# Patient Record
Sex: Male | Born: 1970 | Race: White | Hispanic: No | Marital: Married
Health system: Southern US, Community
[De-identification: ages and names within clinical notes are randomized; demographics above are authoritative.]

---

## 2016-08-17 ENCOUNTER — Other Ambulatory Visit: Payer: Self-pay | Admitting: Family Medicine

## 2016-08-17 DIAGNOSIS — R109 Unspecified abdominal pain: Secondary | ICD-10-CM

## 2016-08-23 ENCOUNTER — Ambulatory Visit
Admission: RE | Admit: 2016-08-23 | Discharge: 2016-08-23 | Disposition: A | Discharge status: Home / Self Care | Payer: 59 | Source: Ambulatory Visit | Attending: Family Medicine | Admitting: Family Medicine

## 2016-08-23 DIAGNOSIS — R109 Unspecified abdominal pain: Secondary | ICD-10-CM

## 2019-07-19 ENCOUNTER — Emergency Department (HOSPITAL_COMMUNITY)
Admission: EM | Admit: 2019-07-19 | Discharge: 2019-07-19 | Disposition: A | Discharge status: Home / Self Care | Payer: POS | Attending: Emergency Medicine | Admitting: Emergency Medicine

## 2019-07-19 ENCOUNTER — Other Ambulatory Visit: Payer: Self-pay

## 2019-07-19 ENCOUNTER — Encounter (HOSPITAL_COMMUNITY): Payer: Self-pay

## 2019-07-19 ENCOUNTER — Emergency Department (HOSPITAL_COMMUNITY): Payer: POS

## 2019-07-19 DIAGNOSIS — Y999 Unspecified external cause status: Secondary | ICD-10-CM | POA: Insufficient documentation

## 2019-07-19 DIAGNOSIS — M542 Cervicalgia: Secondary | ICD-10-CM | POA: Insufficient documentation

## 2019-07-19 DIAGNOSIS — S40012A Contusion of left shoulder, initial encounter: Secondary | ICD-10-CM

## 2019-07-19 DIAGNOSIS — Y9389 Activity, other specified: Secondary | ICD-10-CM | POA: Diagnosis not present

## 2019-07-19 DIAGNOSIS — W109XXA Fall (on) (from) unspecified stairs and steps, initial encounter: Secondary | ICD-10-CM | POA: Diagnosis not present

## 2019-07-19 DIAGNOSIS — Y92008 Other place in unspecified non-institutional (private) residence as the place of occurrence of the external cause: Secondary | ICD-10-CM | POA: Diagnosis not present

## 2019-07-19 DIAGNOSIS — R51 Headache: Secondary | ICD-10-CM | POA: Insufficient documentation

## 2019-07-19 DIAGNOSIS — R0789 Other chest pain: Secondary | ICD-10-CM | POA: Diagnosis not present

## 2019-07-19 DIAGNOSIS — S4992XA Unspecified injury of left shoulder and upper arm, initial encounter: Secondary | ICD-10-CM | POA: Diagnosis present

## 2019-07-19 DIAGNOSIS — M7918 Myalgia, other site: Secondary | ICD-10-CM

## 2019-07-19 DIAGNOSIS — M25512 Pain in left shoulder: Secondary | ICD-10-CM

## 2019-07-19 NOTE — ED Triage Notes (Signed)
Patient arrived via [facility]    Patient was standing at the top of the stairs and reports "I don't know how it happened, but I was just falling down boom, boom, boom on my back. I mean maybe I tripped over my socks".   Patient received 182mcg of Fentanyl with EMS for 10/10 pain. Pain decreased to a 6/10 shortly after.   Patient reports 8/10 left upper back pain and left neck pain, unable to move arm because of pain. Pain increases upon inhalation, no SOB, no alcohol consumption, no drug use.   Patient has chronic lower back pain since '96.   Per EMS patient has no evidence of neck or spinal injury, but put on the c-collar "just in case".   Vitals were stable with [facility], A&Ox4

## 2019-07-19 NOTE — Discharge Instructions (Signed)
Please read and follow all provided instructions.  Your diagnoses today include:  1. Acute pain of left shoulder   2. Contusion of left shoulder, initial encounter   3. Musculoskeletal pain     Tests performed today include:  Vital signs. See below for your results today.   Medications prescribed:   Please use over-the-counter NSAID medications (ibuprofen, naproxen) as directed on the packaging for pain.   Take any prescribed medications only as directed.  Home care instructions:  Follow any educational materials contained in this packet. The worst pain and soreness will be 24-48 hours after the fall. Your symptoms should resolve steadily over several days at this time. Use warmth on affected areas as needed.   Follow-up instructions: Please follow-up with your primary care provider in 1 week for further evaluation of your symptoms if they are not completely improved.   Return instructions:   Please return to the Emergency Department if you experience worsening symptoms.   Please return if you experience increasing pain, vomiting, vision or hearing changes, confusion, numbness or tingling in your arms or legs, or if you feel it is necessary for any reason.   Please return if you have any other emergent concerns.  Additional Information:  Your vital signs today were: BP (!) 157/95 (BP Location: Right Arm)    Pulse 60    Temp 98.4 F (36.9 C) (Oral)    Resp 16    SpO2 100%  If your blood pressure (BP) was elevated above 135/85 this visit, please have this repeated by your doctor within one month. --------------

## 2019-07-19 NOTE — ED Notes (Signed)
Patient verbalizes understanding of discharge instructions. Opportunity for questioning and answers were provided. Armband removed by staff, pt discharged from ED.  

## 2019-07-19 NOTE — ED Provider Notes (Signed)
[facility] Provider [facility]   CSN: [id number] Arrival date & time: 07/19/19  1415     History   Chief Complaint Chief Complaint  Patient presents with  . Fall    HPI Christopher Freeman is a 48 y.o. male.     Patient presents to the emergency department today with complaint of a fall occurring just prior to arrival.  Patient was transported to the emergency department by EMS.  Patient was feeling in a completely normal state of health when he went to go down several stairs.  He lost his footing and slipped rolling down about 12 stairs on his left side.  His wife heard him fall and went to see him.  Patient was slightly dazed but not confused.  He was awake and talkative.  Patient complained of neck pain (improved), left shoulder pain, numbness in his left arm (new improving), left axillary rib cage pain.  Patient was given 100 mcg of fentanyl prior by EMS.  Pain is currently controlled.  Pain is worse with movement.  Patient denies any difficulty breathing, other chest pain, abdominal pain.  No pain in hips or legs.  He has not had any vomiting, vision changes.  He denies drugs or alcohol.  No anticoagulant use.       History reviewed. No pertinent past medical history.  There are no active problems to display for this patient.   History reviewed. No pertinent surgical history.      Home Medications    Prior to Admission medications   Not on File    Family History History reviewed. No pertinent family history.  Social History Social History   Tobacco Use  . Smoking status: Not on file  Substance Use Topics  . Alcohol use: Not on file  . Drug use: Not on file     Allergies   Patient has no allergy information on record.   Review of Systems Review of Systems  Constitutional: Negative for fatigue.  HENT: Negative for tinnitus.   Eyes: Negative for photophobia, pain and visual disturbance.  Respiratory: Negative for  shortness of breath.   Cardiovascular: Negative for chest pain.  Gastrointestinal: Negative for nausea and vomiting.  Musculoskeletal: Positive for arthralgias, back pain and neck pain. Negative for gait problem.  Skin: Negative for wound.  Neurological: Positive for numbness (resolved). Negative for dizziness, weakness, light-headedness and headaches.  Psychiatric/Behavioral: Negative for confusion and decreased concentration.     Physical Exam Updated Vital Signs BP (!) 157/95 (BP Location: Right Arm)   Pulse 60   Temp 98.4 F (36.9 C) (Oral)   Resp 16   SpO2 100%   Physical Exam Vitals signs and nursing note reviewed.  Constitutional:      Appearance: He is well-developed.  HENT:     Head: Normocephalic and atraumatic. No raccoon eyes or Battle's sign.     Right Ear: Tympanic membrane, ear canal and external ear normal. No hemotympanum.     Left Ear: Tympanic membrane, ear canal and external ear normal. No hemotympanum.     Nose: Nose normal.  Eyes:     General: Lids are normal.     Conjunctiva/sclera: Conjunctivae normal.     Pupils: Pupils are equal, round, and reactive to light.     Comments: No visible hyphema  Neck:     Musculoskeletal: Neck supple.     Comments: Immobilized in c-collar. Cardiovascular:     Rate and Rhythm: Normal rate and regular  rhythm.     Comments: Chest wall pain with palpation just below the left axilla. Pulmonary:     Effort: Pulmonary effort is normal.     Breath sounds: Normal breath sounds.     Comments: Good air movement bilaterally. Abdominal:     Palpations: Abdomen is soft.     Tenderness: There is no abdominal tenderness.  Musculoskeletal:        General: Tenderness present. No swelling.     Left shoulder: He exhibits decreased range of motion (Able to lift arm but with significant pain), tenderness and bony tenderness. He exhibits no swelling.     Left elbow: Normal.     Left wrist: Normal.     Left hip: Normal.     Cervical  back: He exhibits normal range of motion, no tenderness and no bony tenderness.     Thoracic back: He exhibits no tenderness and no bony tenderness.     Lumbar back: He exhibits no tenderness and no bony tenderness.     Left upper arm: Normal.     Left forearm: Normal.     Left hand: Normal.     Left upper leg: Normal.  Skin:    General: Skin is warm and dry.  Neurological:     Mental Status: He is alert and oriented to person, place, and time.     GCS: GCS eye subscore is 4. GCS verbal subscore is 5. GCS motor subscore is 6.     Cranial Nerves: No cranial nerve deficit.     Sensory: No sensory deficit.     Coordination: Coordination normal.     Deep Tendon Reflexes: Reflexes are normal and symmetric.      ED Treatments / Results  Labs (all labs ordered are listed, but only abnormal results are displayed) Labs Reviewed - No data to display  EKG None  Radiology Dg Chest 2 View  Result Date: 07/19/2019 CLINICAL DATA:  Pain after fall EXAM: CHEST - 2 VIEW COMPARISON:  None. FINDINGS: The heart size and mediastinal contours are within normal limits. Both lungs are clear. The visualized skeletal structures are unremarkable. IMPRESSION: No active cardiopulmonary disease. Electronically Signed   By: Dorise Bullion Freeman M.D   On: 07/19/2019 16:58   Dg Cervical Spine 2-3 View Clearing  Result Date: 07/19/2019 CLINICAL DATA:  Pain after fall EXAM: LIMITED CERVICAL SPINE FOR TRAUMA CLEARING - 2-3 VIEW COMPARISON:  None. FINDINGS: Straightening of normal lordosis. No traumatic malalignment. No fracture. Multilevel degenerative changes with anterior osteophytes. No other abnormalities. IMPRESSION: No fracture or traumatic malalignment.  Degenerative changes noted. Electronically Signed   By: Dorise Bullion Freeman M.D   On: 07/19/2019 16:59   Dg Shoulder Left  Result Date: 07/19/2019 CLINICAL DATA:  Pain after fall EXAM: LEFT SHOULDER - 2+ VIEW COMPARISON:  None. FINDINGS: There is no evidence of  fracture or dislocation. There is no evidence of arthropathy or other focal bone abnormality. Soft tissues are unremarkable. IMPRESSION: Negative. Electronically Signed   By: Dorise Bullion Freeman M.D   On: 07/19/2019 16:59    Procedures Procedures (including critical care time)  Medications Ordered in ED Medications - No data to display   Initial Impression / Assessment and Plan / ED Course  I have reviewed the triage vital signs and the nursing notes.  Pertinent labs & imaging results that were available during my care of the patient were reviewed by me and considered in my medical decision making (see chart for details).  Patient seen and examined. Work-up initiated.  Patient declines pain medications at this time.  Will obtain plain film imaging.  No preceding symptoms and this seems consistent with a non-syncopal fall.  Vital signs reviewed and are as follows: BP (!) 157/95 (BP Location: Right Arm)   Pulse 60   Temp 98.4 F (36.9 C) (Oral)   Resp 16   SpO2 100%   5:09 PM x-rays reviewed and patient reassessed.  I removed the cervical collar.  Patient has not developed any pain in his neck or difficulty with movement.  He has had no progression of symptoms.  No confusion, vomiting.  He has a mild headache.  At this point no indication for further imaging.  Patient is comfortable with going home.  He states that he will use sling for comfort, Tylenol and ibuprofen at home.  Offered muscle relaxer, he declines.  Discussed follow-up with his PCP in 1 week if his pain is not improving as expected.  Final Clinical Impressions(s) / ED Diagnoses   Final diagnoses:  Acute pain of left shoulder  Contusion of left shoulder, initial encounter  Musculoskeletal pain   Patient with left upper body pain after a fall down several stairs today.  No significant head injury suspected and patient with negative head CT rules.  He has not decompensated while in the emergency department.  He  does not show any symptoms suggestive of concussion.  No peripheral neurological deficits.  Pain seems to be worse in his left shoulder region and is worse with movement.  Left upper extremity is neurovascularly intact.  No lower extremity injuries or neurologic deficits.  Chest x-ray is clear.  Patient without any chest pain, abdominal pain, shortness of breath.  Do not feel that advanced imaging of the chest, abdomen, pelvis indicated at this time.  Will treat symptomatically as above.  ED Discharge Orders    None       Renne CriglerGeiple, Alliene Klugh, Cordelia Poche-C 07/19/19 1711    Tilden Fossaees, Elizabeth, MD 07/19/19 [phone]

## 2020-03-03 ENCOUNTER — Ambulatory Visit: Payer: POS | Attending: Internal Medicine

## 2020-03-03 DIAGNOSIS — Z23 Encounter for immunization: Secondary | ICD-10-CM

## 2020-03-03 NOTE — Progress Notes (Signed)
   Covid-19 Vaccination Clinic  Name:  Christopher Freeman    MRN: [id number] DOB: 10-10-1971  03/03/2020  Mr. Ortwein was observed post Covid-19 immunization for 15 minutes without incident. He was provided with Vaccine Information Sheet and instruction to access the V-Safe system.   Mr. Shimel was instructed to call 911 with any severe reactions post vaccine: Marland Kitchen Difficulty breathing  . Swelling of face and throat  . A fast heartbeat  . A bad rash all over body  . Dizziness and weakness   Immunizations Administered    Name Date Dose VIS Date Route   Pfizer COVID-19 Vaccine 03/03/2020  8:26 AM 0.3 mL 11/20/2019 Intramuscular   Manufacturer: [employer], [employer]   Lot: [id number]   NDC: [address]-0

## 2020-03-28 ENCOUNTER — Ambulatory Visit: Payer: POS | Attending: Internal Medicine

## 2020-03-28 DIAGNOSIS — Z23 Encounter for immunization: Secondary | ICD-10-CM

## 2020-03-28 NOTE — Progress Notes (Signed)
   Covid-19 Vaccination Clinic  Name:  Christopher Freeman    MRN: [id number] DOB: October 14, 1971  03/28/2020  [name] was observed post Covid-19 immunization for 15 minutes without incident. He was provided with Vaccine Information Sheet and instruction to access the V-Safe system.   [name] was instructed to call 911 with any severe reactions post vaccine: Marland Kitchen Difficulty breathing  . Swelling of face and throat  . A fast heartbeat  . A bad rash all over body  . Dizziness and weakness   Immunizations Administered    Name Date Dose VIS Date Route   Pfizer COVID-19 Vaccine 03/28/2020  8:28 AM 0.3 mL 02/03/2019 Intramuscular   Manufacturer: [employer], [employer]   Lot: [id number]   NDC: [address]-4

## 2020-04-05 ENCOUNTER — Other Ambulatory Visit (HOSPITAL_BASED_OUTPATIENT_CLINIC_OR_DEPARTMENT_OTHER): Payer: Self-pay | Admitting: Family Medicine

## 2020-04-05 DIAGNOSIS — N50811 Right testicular pain: Secondary | ICD-10-CM

## 2020-04-06 ENCOUNTER — Ambulatory Visit (HOSPITAL_BASED_OUTPATIENT_CLINIC_OR_DEPARTMENT_OTHER)
Admission: RE | Admit: 2020-04-06 | Discharge: 2020-04-06 | Disposition: A | Discharge status: Home / Self Care | Payer: POS | Source: Ambulatory Visit | Attending: Family Medicine | Admitting: Family Medicine

## 2020-04-06 ENCOUNTER — Other Ambulatory Visit: Payer: Self-pay

## 2020-04-06 DIAGNOSIS — N50811 Right testicular pain: Secondary | ICD-10-CM | POA: Diagnosis present

## 2020-12-29 ENCOUNTER — Other Ambulatory Visit: Payer: Self-pay | Admitting: Family Medicine

## 2020-12-29 DIAGNOSIS — M542 Cervicalgia: Secondary | ICD-10-CM

## 2021-01-11 ENCOUNTER — Ambulatory Visit
Admission: RE | Admit: 2021-01-11 | Discharge: 2021-01-11 | Disposition: A | Discharge status: Home / Self Care | Payer: POS | Source: Ambulatory Visit | Attending: Family Medicine | Admitting: Family Medicine

## 2021-01-11 DIAGNOSIS — M542 Cervicalgia: Secondary | ICD-10-CM

## 2021-11-16 IMAGING — US US SCROTUM W/ DOPPLER COMPLETE
1 series · 13 of 25 positions shown · non-contrast
Comparison: 05/09/2011

CLINICAL DATA: Right testicular pain.  Vasectomy 1211.

EXAM:
SCROTAL ULTRASOUND
DOPPLER ULTRASOUND OF THE TESTICLES
TECHNIQUE: Complete ultrasound examination of the testicles, epididymis, and
other scrotal structures was performed. Color and spectral Doppler
ultrasound were also utilized to evaluate blood flow to the
testicles.

[Series 1: us scrotum w/ doppler complete · 13 of 45 slices shown]
[im 1/45]
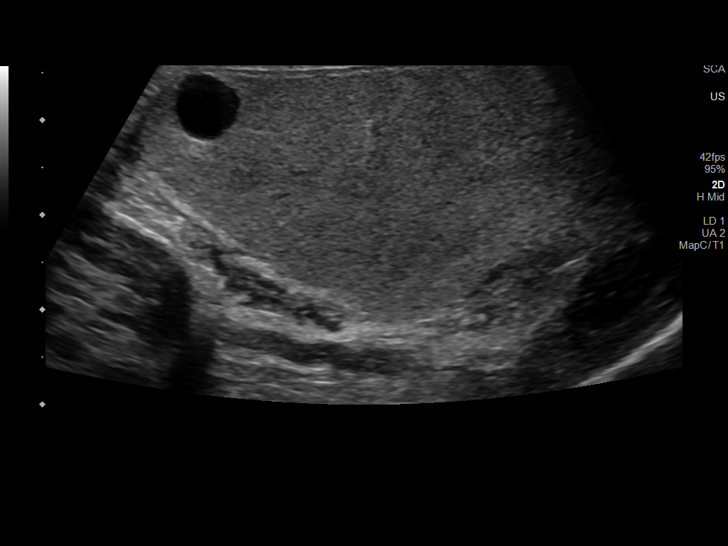
[im 4/45]
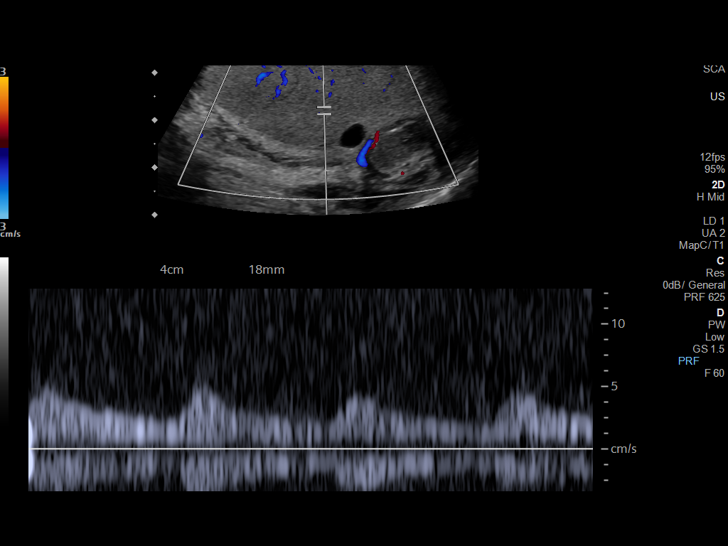
[im 8/45]
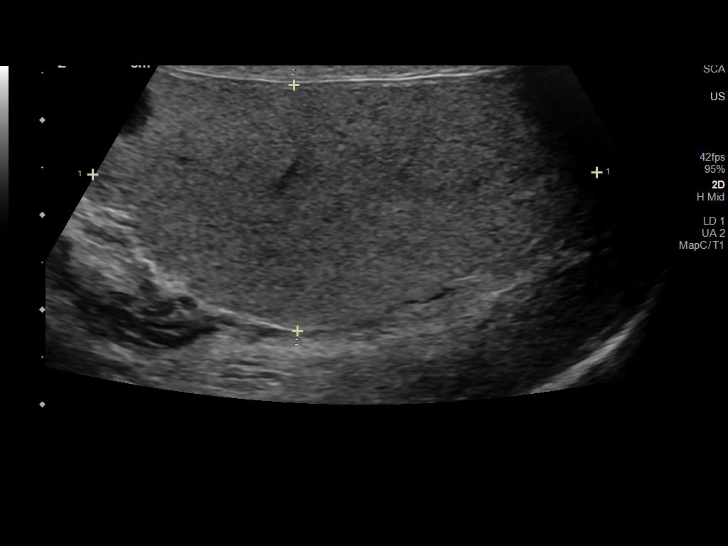
[im 12/45]
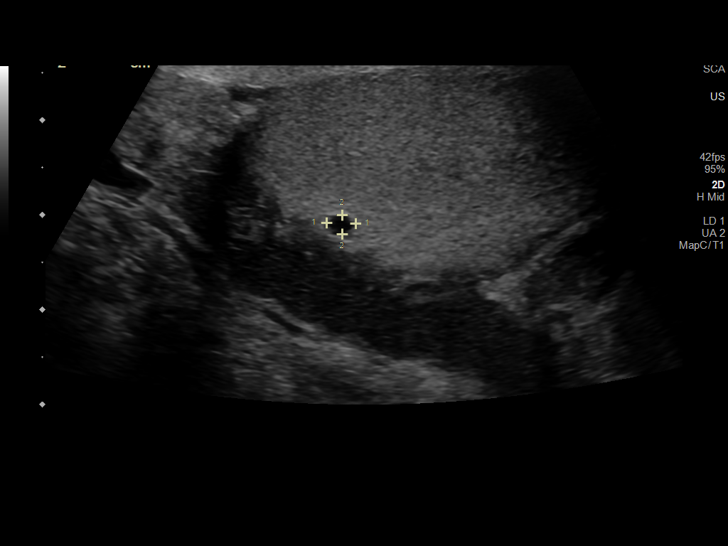
[im 15/45]
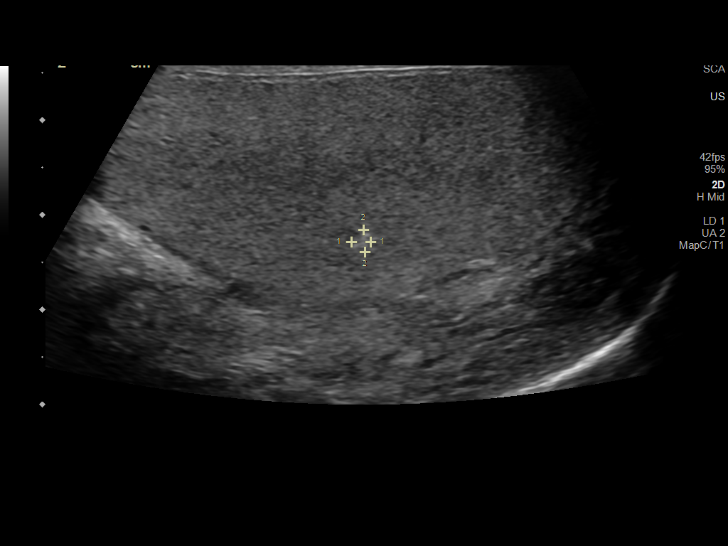
[im 19/45]
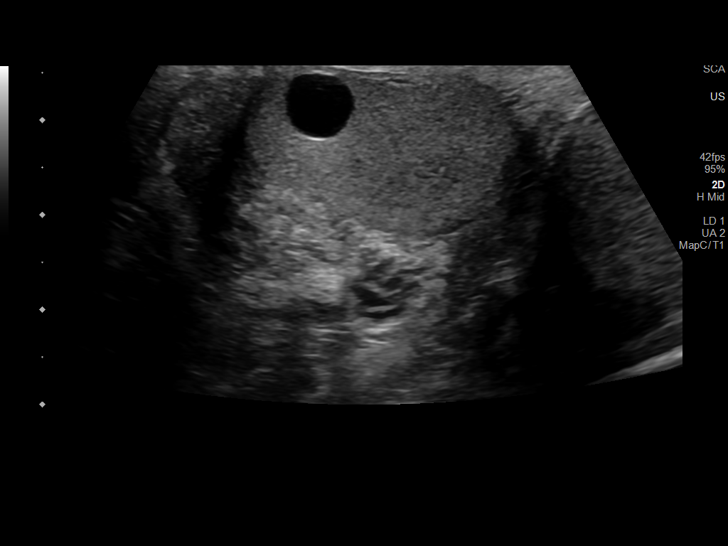
[im 23/45]
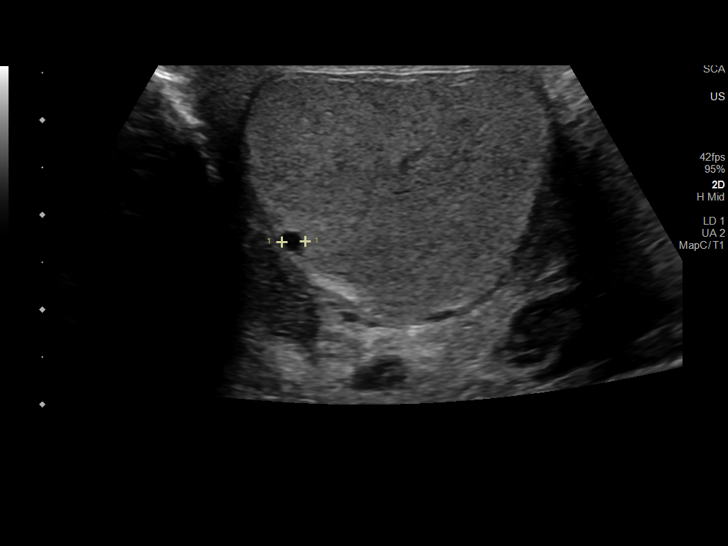
[im 26/45]
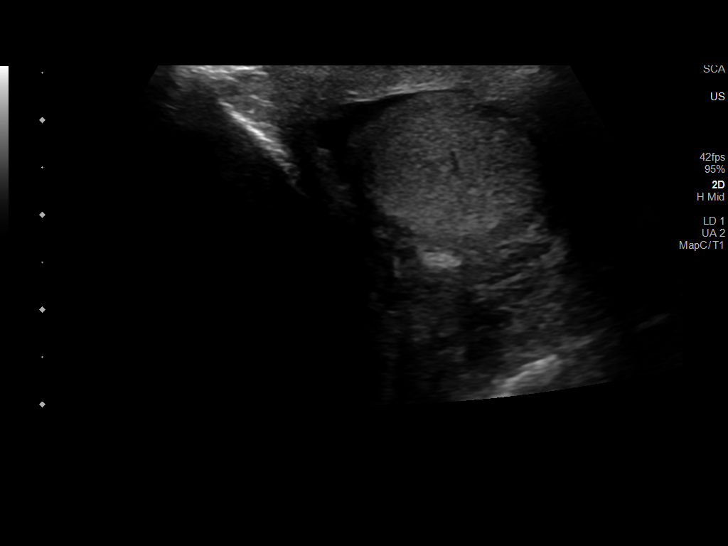
[im 30/45]
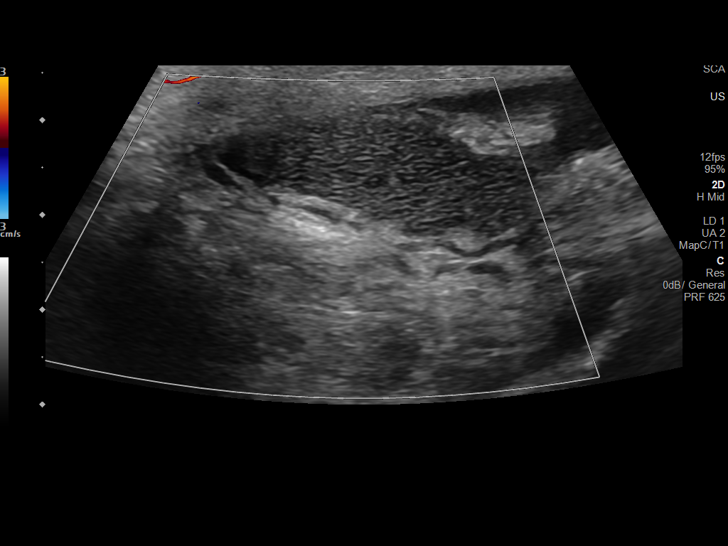
[im 34/45]
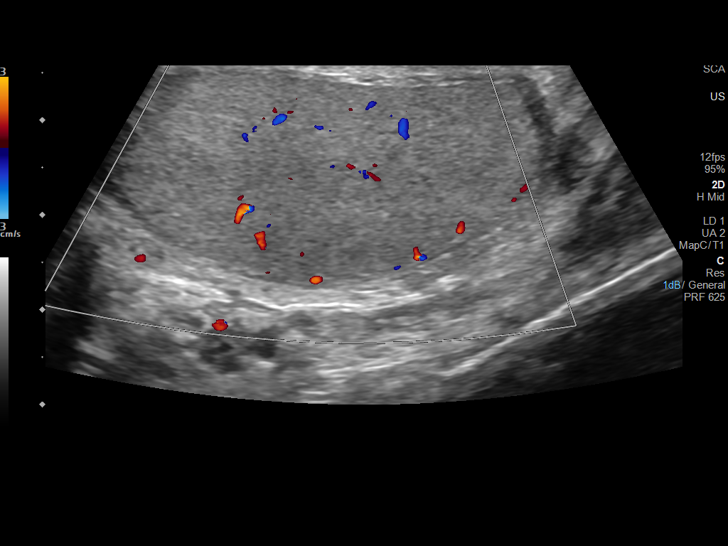
[im 37/45]
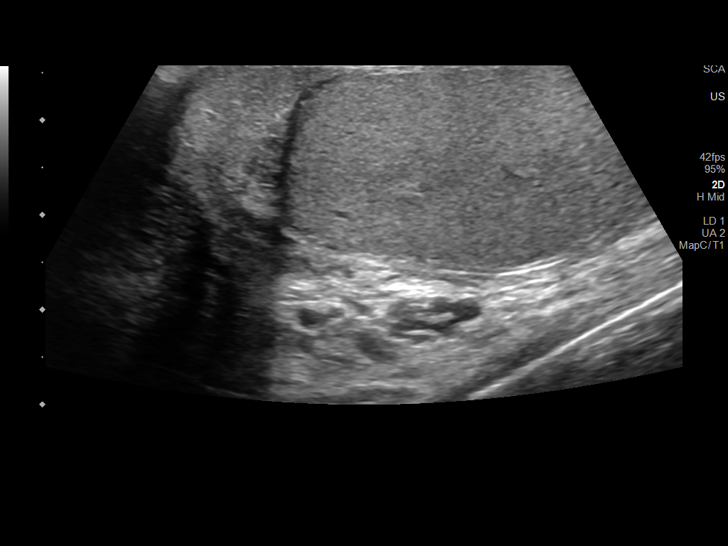
[im 41/45]
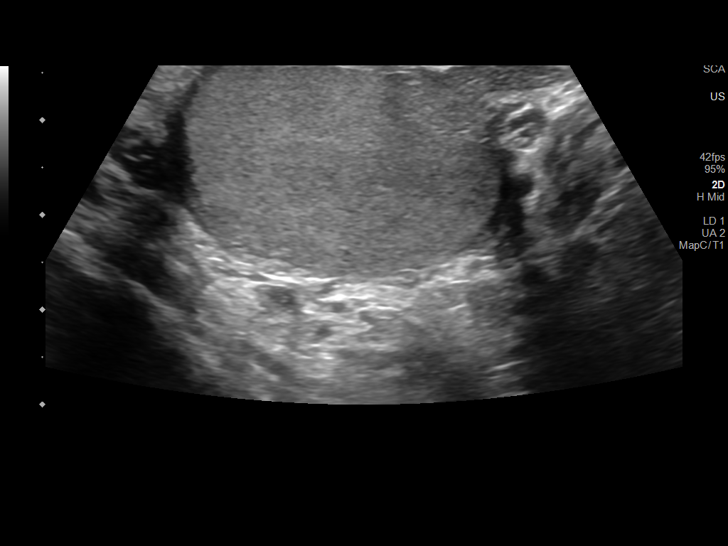
[im 45/45]
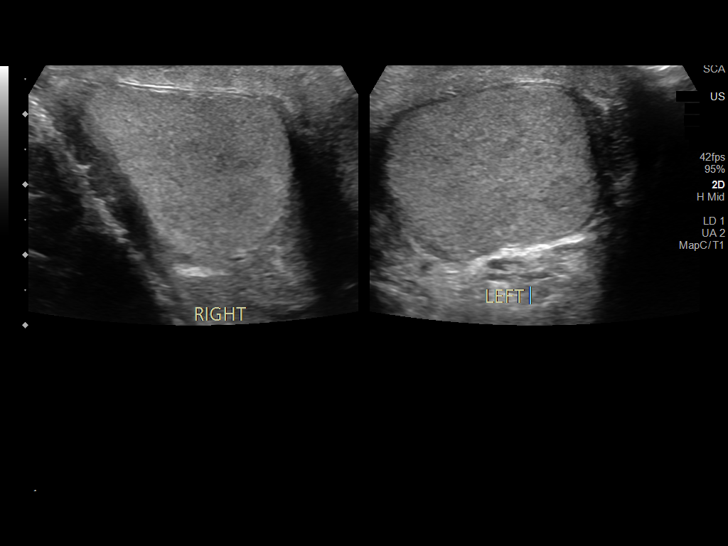

[13 of 25 positions shown; findings below may reference images not displayed]

FINDINGS: Right testicle

Measurements: 5.3 x 2.6 x 3.2 cm. Three intratesticular cysts are
present with the largest measuring 9 mm over the upper pole. At
least 2 of these were seen previously with slight increase in size
in the upper pole cyst which previously measured 4 mm. No focal
solid mass. Single focal 2 mm calcification over the mid pole.

Left testicle

Measurements: 5.2 x 2.3 x 3.1 cm. No mass or microlithiasis
visualized.

Right epididymis:  Normal in size and appearance.

Left epididymis:  Normal in size and appearance.

Hydrocele:  Small bilateral hydroceles right worse than left.

Varicocele:  None visualized.

Pulsed Doppler interrogation of both testes demonstrates normal low
resistance arterial and venous waveforms bilaterally.
IMPRESSION: 1.  Normal size testicles with normal symmetric vascular flow.

2. Three right-sided intratesticular cysts with the largest
measuring 9 mm over the upper pole. Single 2 mm right testicular
calcification.

3.  Tiny bilateral hydroceles right worse than left.

## 2023-01-13 NOTE — Therapy (Signed)
OUTPATIENT PHYSICAL THERAPY THORACOLUMBAR EVALUATION   Patient Name: Christopher Freeman MRN: [id number] DOB:Mar 04, 1971, 52 y.o., male Today's Date: 01/15/2023  END OF SESSION:  PT End of Session - 01/14/23 1717     Visit Number 1    Number of Visits 12    Date for PT Re-Evaluation 02/25/23    Authorization Type [employer]    PT Start Time 1622   Pt was 19 mins late due to going to the wrong entrance   PT Stop Time 1707    PT Time Calculation (min) 45 min    Activity Tolerance Patient tolerated treatment well    Behavior During Therapy [facility] for tasks assessed/performed             History reviewed. No pertinent past medical history. History reviewed. No pertinent surgical history. There are no problems to display for this patient.   PCP: Charlsie Merles, MD  REFERRING PROVIDER: Charlsie Merles, MD  REFERRING DIAG: M54.50 (ICD-10-CM) - Low back pain, unspecified  Rationale for Evaluation and Treatment: Rehabilitation  THERAPY DIAG:  Other low back pain  Muscle weakness (generalized)  Difficulty in walking, not elsewhere classified  ONSET DATE: PT orders in August and December 2023  SUBJECTIVE:                                                                                                                                                                                           SUBJECTIVE STATEMENT: Pt reports his back pain began in [employer] om 1995.  He had significant pain when his back popped during sit ups.  He states L4-5 were damaged.  Pt states the next year his back went out on him again when he was bending to lift an objects, so much that he lost his breath.  He states his back gives out on him every year and he has to be very careful with his movements.  He reports no specific time of recent exacerbation.  He reports having sciatica in bilat LE's. Pt's original PT order in August indicated aquatic therapy.  Pt went to a facility for PT though they didn't have  aquatic therapy.  Pt only had an assessment performed, and was trying to find a PT clinic that had a pool for aquatic therapy.   Pt denies any lumbar surgery.  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  Pt has tried acupuncture which only helped for 20 mins.   Pt has received some PT in the past, though has been awhile.     He is very limited with lifting  objects and has to be careful with lifting.  Pt is limited with bending over.  Pt has difficulty sleeping every night.  Pt has excruciating pain with kneeling.  Pt is limited with walks with his wife.  His pain will freeze him with ambulation.  Pt has sciatica daily.  It can cause excruciating pain in bilat ankles which limits him ambulation.  He also has pain with standing and sitting.  He gets H/A's from the pain.  Easing factors:   "not a whole lot that makes it feel better".  TENS units.  medication helps some.  Theracane provides temporary relief.   PERTINENT HISTORY:  Chronic low back pain Hx of cervical pain, depression, HTN, and tinnitus  PAIN:  Are you having pain? Yes NPRS:  6-6.5/10 Current, 10/10 Worst, 5/10 Best Location:  bilat sides of lumbar.  Pain travels down bilat LE's from buttocks to ankles/feet.  He has tingling in feet.  Pt has thoracic pain also  PRECAUTIONS: None  WEIGHT BEARING RESTRICTIONS: No  FALLS:  Has patient fallen in last 6 months? 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   PLOF: Independent.  Pt has a hx of chronic pain which affect his daily activities and functional mobility.   PATIENT GOALS: reduce pain.  To be able to function to have a decent QOL   OBJECTIVE:   DIAGNOSTIC FINDINGS:  Pt has has diagnostic testing at other facilities though PT unable to view any reports.  PATIENT SURVEYS:  Give FOTO next visit.   COGNITION: Overall cognitive status: Within functional limits for  tasks assessed      LUMBAR ROM:   AROM eval  Flexion 40%  Extension 25%  Right lateral flexion 60%  Left lateral flexion 60%  Right rotation 30%  Left rotation 25%    (Pt had pain with all AROM testing)  LOWER EXTREMITY MMT:      MMT  Right eval Left eval  Hip flexion 4-/5 4/5  Hip extension    Hip abduction    Hip adduction    Hip internal rotation    Hip external rotation 3+/5 4/5  Knee flexion 4-/5 seated 4/5 seated   Knee extension 4-/5 4-/5  Ankle dorsiflexion    Ankle plantarflexion Weak tested in sitting.  Weaker on R Weak tested in sitting  Ankle inversion    Ankle eversion     (Blank rows = not tested)    GAIT: Assistive device utilized: None Level of assistance: Complete Independence Comments: Slow, antalgic gait.  Pt c/o's of back pain which worsens down LE's with ambulation.   TODAY'S TREATMENT:                                                                                                                               See below for pt education  PATIENT EDUCATION:  Education details:  Educated pt with aquatic therapy process and aquatic properties, benefits, and purpose.  Educated pt concerning POC, relevant anatomy, objective findings, and dx.  PT answered Pt's questions.  Person educated: Patient Education method: Explanation Education comprehension: verbalized understanding  HOME EXERCISE PROGRAM: None given today  ASSESSMENT:  CLINICAL IMPRESSION: Patient is a 52 y.o. male with a dx of LBP presenting to the clinic with chronic LBP, limited ROM in lumbar, and bilat LE muscle weakness.  Pt c/o's of constant pain and reports his back gives out on him yearly.  He reports having sciatic pain down entire LE's daily.  He has to be careful with his movements and lifting.  Pt is limited with ambulation distance and states he freezes with ambulation occasionally.  Pt is limited with bending and has increased pain with standing and sitting.  He has  disturbed sleep every night.  Pt has orders for aquatic therapy.  Pt may benefit from skilled PT services to address impairments and to improve overall function.    OBJECTIVE IMPAIRMENTS: Abnormal gait, decreased activity tolerance, decreased endurance, decreased mobility, difficulty walking, decreased ROM, decreased strength, hypomobility, increased fascial restrictions, impaired flexibility, and pain.   ACTIVITY LIMITATIONS: carrying, lifting, bending, sitting, standing, squatting, sleeping, stairs, and locomotion level  PARTICIPATION LIMITATIONS: meal prep, cleaning, laundry, shopping, community activity, and occupation  PERSONAL FACTORS: Time since onset of injury/illness/exacerbation and 1-2 comorbidities: cervical/thoracic pain and depression  are also affecting patient's functional outcome.   REHAB POTENTIAL: Good  CLINICAL DECISION MAKING: Evolving/moderate complexity  EVALUATION COMPLEXITY: Moderate   GOALS:  SHORT TERM GOALS: Target date: 02/04/2023   Pt will tolerate aquatic therapy without adverse effects for improved tolerance to activity, pain, strength, and function.  Baseline: Goal status: INITIAL  2.  Pt will report at least a 25% improvement in pain and sx's overall.  Baseline:  Goal status: INITIAL  3.  Pt will report at least a 25% decrease in freezing episodes with ambulation.   Baseline:  Goal status: INITIAL  4.  Pt will demo at least a 25% improvement in each direction of lumbar AROM for improved mobility.  Baseline:  Goal status: INITIAL  5.  Pt will ambulate with improved speed and reduced pain with ambulation.   Baseline:  Goal status: INITIAL Target date:  02/11/2023    LONG TERM GOALS: Target date: 02/25/2023  Pt will demo lumbar AROM to be [facility] t/o and have less pain with AROM testing for improved daily mobility.  Baseline:  Goal status: INITIAL  2.  Pt will demo improved strength in bilat hips and knees to at least 4+/5 MMT for improved  tolerance with and performance of functional mobility.  Baseline:  Goal status: INITIAL  3.  Pt will be able to walk with wife and also ambulate extended community distance without significant pain.  Baseline:  Goal status: INITIAL  4.  Pt will report at least a 60-70% improvement in pain and sx's overall.  Baseline:  Goal status: INITIAL  5.  Pt will be independent with aquatic HEP and land based core HEP for improved tolerance to activity, functional mobility, pain, and strength.   Baseline:  Goal status: INITIAL  6.  Pt will demo proper body mechanics with squat to lift/hip hinge in order to perform functional lifting with reduced stress and strain on lumbar spine. Baseline:  Goal status: INITIAL  PLAN:  PT FREQUENCY: 2x/week  PT DURATION: 6 weeks  PLANNED INTERVENTIONS: Therapeutic exercises, Therapeutic activity, Neuromuscular re-education, Gait training,  Patient/Family education, Self Care, Joint mobilization, Stair training, Aquatic Therapy, Dry Needling, Electrical stimulation, Spinal mobilization, Cryotherapy, Moist heat, Taping, Traction, Ultrasound, Manual therapy, and Re-evaluation.  PLAN FOR NEXT SESSION: Aquatic therapy for approx 3 weeks and then return to land.  Give FOTO next visit   Selinda Michaels III PT, DPT 01/15/23 10:50 PM

## 2023-01-14 ENCOUNTER — Encounter (HOSPITAL_BASED_OUTPATIENT_CLINIC_OR_DEPARTMENT_OTHER): Payer: Self-pay | Admitting: Physical Therapy

## 2023-01-14 ENCOUNTER — Ambulatory Visit (HOSPITAL_BASED_OUTPATIENT_CLINIC_OR_DEPARTMENT_OTHER): Payer: No Typology Code available for payment source | Attending: Internal Medicine | Admitting: Physical Therapy

## 2023-01-14 ENCOUNTER — Other Ambulatory Visit: Payer: Self-pay

## 2023-01-14 DIAGNOSIS — M5459 Other low back pain: Secondary | ICD-10-CM | POA: Insufficient documentation

## 2023-01-14 DIAGNOSIS — R262 Difficulty in walking, not elsewhere classified: Secondary | ICD-10-CM | POA: Diagnosis present

## 2023-01-14 DIAGNOSIS — M6281 Muscle weakness (generalized): Secondary | ICD-10-CM | POA: Diagnosis present

## 2023-01-25 ENCOUNTER — Ambulatory Visit (HOSPITAL_BASED_OUTPATIENT_CLINIC_OR_DEPARTMENT_OTHER): Payer: POS | Admitting: Physical Therapy

## 2023-01-31 ENCOUNTER — Ambulatory Visit (HOSPITAL_BASED_OUTPATIENT_CLINIC_OR_DEPARTMENT_OTHER): Payer: POS | Admitting: Physical Therapy

## 2023-02-06 ENCOUNTER — Encounter (HOSPITAL_BASED_OUTPATIENT_CLINIC_OR_DEPARTMENT_OTHER): Payer: Self-pay | Admitting: Physical Therapy

## 2023-02-06 ENCOUNTER — Ambulatory Visit (HOSPITAL_BASED_OUTPATIENT_CLINIC_OR_DEPARTMENT_OTHER): Payer: No Typology Code available for payment source | Admitting: Physical Therapy

## 2023-02-06 DIAGNOSIS — M5459 Other low back pain: Secondary | ICD-10-CM | POA: Diagnosis not present

## 2023-02-06 DIAGNOSIS — M6281 Muscle weakness (generalized): Secondary | ICD-10-CM

## 2023-02-06 DIAGNOSIS — R262 Difficulty in walking, not elsewhere classified: Secondary | ICD-10-CM

## 2023-02-06 NOTE — Therapy (Signed)
OUTPATIENT PHYSICAL THERAPY THORACOLUMBAR EVALUATION   Patient Name: Christopher Freeman MRN: [id number] DOB:1971/11/29, 52 y.o., male Today's Date: 02/06/2023  END OF SESSION:  PT End of Session - 02/06/23 1644     Visit Number 2    Number of Visits 12    Date for PT Re-Evaluation 02/25/23    Authorization Type [employer]    PT Start Time 1644   pt late for session   PT Stop Time 1715    PT Time Calculation (min) 31 min    Activity Tolerance Patient tolerated treatment well    Behavior During Therapy [facility] for tasks assessed/performed              No past medical history on file. No past surgical history on file. There are no problems to display for this patient.   PCP: Charlsie Merles, MD  REFERRING PROVIDER: Charlsie Merles, MD  REFERRING DIAG: M54.50 (ICD-10-CM) - Low back pain, unspecified  Rationale for Evaluation and Treatment: Rehabilitation  THERAPY DIAG:  Other low back pain  Muscle weakness (generalized)  Difficulty in walking, not elsewhere classified  ONSET DATE: PT orders in August and December 2023  SUBJECTIVE:                                                                                                                                                                                           SUBJECTIVE STATEMENT: "Not afraid of water"   Pt reports his back pain began in [employer] [place] 1995.  He had significant pain when his back popped during sit ups.  He states L4-5 were damaged.  Pt states the next year his back went out on him again when he was bending to lift an objects, so much that he lost his breath.  He states his back gives out on him every year and he has to be very careful with his movements.  He reports no specific time of recent exacerbation.  He reports having sciatica in bilat LE's. Pt's original PT order in August indicated aquatic therapy.  Pt went to a facility for PT though they didn't have aquatic therapy.  Pt only had an  assessment performed, and was trying to find a PT clinic that had a pool for aquatic therapy.   Pt denies any lumbar surgery.  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  Pt has tried acupuncture which only helped for 20 mins.   Pt has received some PT in the past, though has been awhile.     He is very limited with lifting objects and  has to be careful with lifting.  Pt is limited with bending over.  Pt has difficulty sleeping every night.  Pt has excruciating pain with kneeling.  Pt is limited with walks with his wife.  His pain will freeze him with ambulation.  Pt has sciatica daily.  It can cause excruciating pain in bilat ankles which limits him ambulation.  He also has pain with standing and sitting.  He gets H/A's from the pain.  Easing factors:   "not a whole lot that makes it feel better".  TENS units.  medication helps some.  Theracane provides temporary relief.   PERTINENT HISTORY:  Chronic low back pain Hx of cervical pain, depression, HTN, and tinnitus  PAIN:  Are you having pain? Yes NPRS:  6-6.5/10 Current, 10/10 Worst, 5/10 Best Location:  bilat sides of lumbar.  Pain travels down bilat LE's from buttocks to ankles/feet.  He has tingling in feet.  Pt has thoracic pain also  PRECAUTIONS: None  WEIGHT BEARING RESTRICTIONS: No  FALLS:  Has patient fallen in last 6 months? 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   PLOF: Independent.  Pt has a hx of chronic pain which affect his daily activities and functional mobility.   PATIENT GOALS: reduce pain.  To be able to function to have a decent QOL   OBJECTIVE:   DIAGNOSTIC FINDINGS:  Pt has has diagnostic testing at other facilities though PT unable to view any reports.  PATIENT SURVEYS:  Give FOTO next visit.   COGNITION: Overall cognitive status: Within functional limits for tasks assessed      LUMBAR ROM:    AROM eval  Flexion 40%  Extension 25%  Right lateral flexion 60%  Left lateral flexion 60%  Right rotation 30%  Left rotation 25%    (Pt had pain with all AROM testing)  LOWER EXTREMITY MMT:      MMT  Right eval Left eval  Hip flexion 4-/5 4/5  Hip extension    Hip abduction    Hip adduction    Hip internal rotation    Hip external rotation 3+/5 4/5  Knee flexion 4-/5 seated 4/5 seated   Knee extension 4-/5 4-/5  Ankle dorsiflexion    Ankle plantarflexion Weak tested in sitting.  Weaker on R Weak tested in sitting  Ankle inversion    Ankle eversion     (Blank rows = not tested)    GAIT: Assistive device utilized: None Level of assistance: Complete Independence Comments: Slow, antalgic gait.  Pt c/o's of back pain which worsens down LE's with ambulation.   TODAY'S TREATMENT:                                                                                                                              Pt seen for aquatic therapy today.  Treatment took place in water 3.25-4.5 ft  in depth at the [facility] pool. Temp of water was 91.  Pt entered/exited the pool via stairs with hand rail.  *Intro to setting *Walking forward, back and side stepping 4.8 ft multiple widths *L stretch with tail wagging *Hip hinging VC, demonstration and manual assist for execution.  *Cycling on noodle for spinal distraction and pain relief; add/abd x 20 *Plank on bench with hip extension R/L x7-8 R/L  Pt requires the buoyancy and hydrostatic pressure of water for support, and to offload joints by unweighting joint load by at least 50 % in navel deep water and by at least 75-80% in chest to neck deep water.  Viscosity of the water is needed for resistance of strengthening. Water current perturbations provides challenge to standing balance requiring increased core activation.    PATIENT EDUCATION:  Education details: Educated pt with aquatic therapy process and aquatic properties,  benefits, and purpose.  Educated pt concerning POC, relevant anatomy, objective findings, and dx.  PT answered Pt's questions.  Person educated: Patient Education method: Explanation Education comprehension: verbalized understanding  HOME EXERCISE PROGRAM: None given today  ASSESSMENT:  CLINICAL IMPRESSION: Pt demonstrates safety and indep in  setting with therapist instructing from deck.  Late for appointment limiting time. He is highly pain sensitive today arriving with 6/10 pain. Thoracic paraspinals are visibly in spasm with complaints of lumbar spine being very tight.  Focus today is on stretching and using the warm water to decrease spasm and pain.  He is able to get balanced on noodle and cycle reducing pain through gentle movement and decompression. He has slight overall reduction in pain by 1-2 points.  He is a good candidate for aquatic intervention and will benefit from the properties of water to improve function, decrease pain and return pt to PLOF.     Initial Impression Patient is a 52 y.o. male with a dx of LBP presenting to the clinic with chronic LBP, limited ROM in lumbar, and bilat LE muscle weakness.  Pt c/o's of constant pain and reports his back gives out on him yearly.  He reports having sciatic pain down entire LE's daily.  He has to be careful with his movements and lifting.  Pt is limited with ambulation distance and states he freezes with ambulation occasionally.  Pt is limited with bending and has increased pain with standing and sitting.  He has disturbed sleep every night.  Pt has orders for aquatic therapy.  Pt may benefit from skilled PT services to address impairments and to improve overall function.    OBJECTIVE IMPAIRMENTS: Abnormal gait, decreased activity tolerance, decreased endurance, decreased mobility, difficulty walking, decreased ROM, decreased strength, hypomobility, increased fascial restrictions, impaired flexibility, and pain.   ACTIVITY  LIMITATIONS: carrying, lifting, bending, sitting, standing, squatting, sleeping, stairs, and locomotion level  PARTICIPATION LIMITATIONS: meal prep, cleaning, laundry, shopping, community activity, and occupation  PERSONAL FACTORS: Time since onset of injury/illness/exacerbation and 1-2 comorbidities: cervical/thoracic pain and depression  are also affecting patient's functional outcome.   REHAB POTENTIAL: Good  CLINICAL DECISION MAKING: Evolving/moderate complexity  EVALUATION COMPLEXITY: Moderate   GOALS:  SHORT TERM GOALS: Target date: 02/04/2023   Pt will tolerate aquatic therapy without adverse effects for improved tolerance to activity, pain, strength, and function.  Baseline: Goal status: INITIAL  2.  Pt will report at least a 25% improvement in pain and sx's overall.  Baseline:  Goal status: INITIAL  3.  Pt will report at least a 25% decrease in freezing episodes with  ambulation.   Baseline:  Goal status: INITIAL  4.  Pt will demo at least a 25% improvement in each direction of lumbar AROM for improved mobility.  Baseline:  Goal status: INITIAL  5.  Pt will ambulate with improved speed and reduced pain with ambulation.   Baseline:  Goal status: INITIAL Target date:  02/11/2023    LONG TERM GOALS: Target date: 02/25/2023  Pt will demo lumbar AROM to be [facility] t/o and have less pain with AROM testing for improved daily mobility.  Baseline:  Goal status: INITIAL  2.  Pt will demo improved strength in bilat hips and knees to at least 4+/5 MMT for improved tolerance with and performance of functional mobility.  Baseline:  Goal status: INITIAL  3.  Pt will be able to walk with wife and also ambulate extended community distance without significant pain.  Baseline:  Goal status: INITIAL  4.  Pt will report at least a 60-70% improvement in pain and sx's overall.  Baseline:  Goal status: INITIAL  5.  Pt will be independent with aquatic HEP and land based core HEP for  improved tolerance to activity, functional mobility, pain, and strength.   Baseline:  Goal status: INITIAL  6.  Pt will demo proper body mechanics with squat to lift/hip hinge in order to perform functional lifting with reduced stress and strain on lumbar spine. Baseline:  Goal status: INITIAL  PLAN:  PT FREQUENCY: 2x/week  PT DURATION: 6 weeks  PLANNED INTERVENTIONS: Therapeutic exercises, Therapeutic activity, Neuromuscular re-education, Gait training, Patient/Family education, Self Care, Joint mobilization, Stair training, Aquatic Therapy, Dry Needling, Electrical stimulation, Spinal mobilization, Cryotherapy, Moist heat, Taping, Traction, Ultrasound, Manual therapy, and Re-evaluation.  PLAN FOR NEXT SESSION: Aquatic therapy for approx 3 weeks and then return to land.  Give FOTO next visit   Selinda Michaels III PT, DPT 02/06/23 5:42 PM

## 2023-02-11 ENCOUNTER — Telehealth (HOSPITAL_BASED_OUTPATIENT_CLINIC_OR_DEPARTMENT_OTHER): Payer: Self-pay | Admitting: Physical Therapy

## 2023-02-11 NOTE — Telephone Encounter (Signed)
Mr.Corsello, asked to be just scheduled once a week on Thursdays. At this time, he can only do once a week due to his work schedule. I went ahead and canceled his Tuesday appointments for now.

## 2023-02-12 ENCOUNTER — Ambulatory Visit (HOSPITAL_BASED_OUTPATIENT_CLINIC_OR_DEPARTMENT_OTHER): Payer: POS | Admitting: Physical Therapy

## 2023-02-19 ENCOUNTER — Ambulatory Visit (HOSPITAL_BASED_OUTPATIENT_CLINIC_OR_DEPARTMENT_OTHER): Payer: POS | Admitting: Physical Therapy

## 2023-02-21 ENCOUNTER — Ambulatory Visit (HOSPITAL_BASED_OUTPATIENT_CLINIC_OR_DEPARTMENT_OTHER): Payer: No Typology Code available for payment source | Attending: Internal Medicine | Admitting: Physical Therapy

## 2023-02-21 ENCOUNTER — Encounter (HOSPITAL_BASED_OUTPATIENT_CLINIC_OR_DEPARTMENT_OTHER): Payer: Self-pay | Admitting: Physical Therapy

## 2023-02-21 DIAGNOSIS — R262 Difficulty in walking, not elsewhere classified: Secondary | ICD-10-CM | POA: Insufficient documentation

## 2023-02-21 DIAGNOSIS — M5459 Other low back pain: Secondary | ICD-10-CM | POA: Diagnosis present

## 2023-02-21 DIAGNOSIS — M6281 Muscle weakness (generalized): Secondary | ICD-10-CM | POA: Insufficient documentation

## 2023-02-21 NOTE — Therapy (Signed)
OUTPATIENT PHYSICAL THERAPY THORACOLUMBAR EVALUATION   Patient Name: Christopher Freeman MRN: [id number] DOB:08-Jul-1971, 52 y.o., male Today's Date: 02/21/2023  END OF SESSION:  PT End of Session - 02/21/23 1642     Visit Number 3    Number of Visits 12    Date for PT Re-Evaluation 02/25/23    Authorization Type [employer]    PT Start Time 1635    PT Stop Time [id number]    PT Time Calculation (min) 40 min    Activity Tolerance Patient tolerated treatment well    Behavior During Therapy [facility] for tasks assessed/performed              History reviewed. No pertinent past medical history. History reviewed. No pertinent surgical history. There are no problems to display for this patient.   PCP: Charlsie Merles, MD  REFERRING PROVIDER: Charlsie Merles, MD  REFERRING DIAG: M54.50 (ICD-10-CM) - Low back pain, unspecified  Rationale for Evaluation and Treatment: Rehabilitation  THERAPY DIAG:  Muscle weakness (generalized)  Difficulty in walking, not elsewhere classified  ONSET DATE: PT orders in August and December 2023  SUBJECTIVE:                                                                                                                                                                                           SUBJECTIVE STATEMENT: "Can only come on Thursdays, cancelled all of my Tuesday appt. Pain has not changed. Pain decreased for a few hours after last session"   Pt reports his back pain began in [place] 1995.  He had significant pain when his back popped during sit ups.  He states L4-5 were damaged.  Pt states the next year his back went out on him again when he was bending to lift an objects, so much that he lost his breath.  He states his back gives out on him every year and he has to be very careful with his movements.  He reports no specific time of recent exacerbation.  He reports having sciatica in bilat LE's. Pt's original PT order in August indicated  aquatic therapy.  Pt went to a facility for PT though they didn't have aquatic therapy.  Pt only had an assessment performed, and was trying to find a PT clinic that had a pool for aquatic therapy.   Pt denies any lumbar surgery.  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  Pt has tried acupuncture which only helped for 20 mins.   Pt has received some PT in the past, though has been awhile.  He is very limited with lifting objects and has to be careful with lifting.  Pt is limited with bending over.  Pt has difficulty sleeping every night.  Pt has excruciating pain with kneeling.  Pt is limited with walks with his wife.  His pain will freeze him with ambulation.  Pt has sciatica daily.  It can cause excruciating pain in bilat ankles which limits him ambulation.  He also has pain with standing and sitting.  He gets H/A's from the pain.  Easing factors:   "not a whole lot that makes it feel better".  TENS units.  medication helps some.  Theracane provides temporary relief.   PERTINENT HISTORY:  Chronic low back pain Hx of cervical pain, depression, HTN, and tinnitus  PAIN:  Are you having pain? Yes NPRS:  6-6.5/10 Current, 10/10 Worst, 5/10 Best Location:  bilat sides of lumbar.  Pain travels down bilat LE's from buttocks to ankles/feet.  He has tingling in feet.  Pt has thoracic pain also  PRECAUTIONS: None  WEIGHT BEARING RESTRICTIONS: No  FALLS:  Has patient fallen in last 6 months? 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   PLOF: Independent.  Pt has a hx of chronic pain which affect his daily activities and functional mobility.   PATIENT GOALS: reduce pain.  To be able to function to have a decent QOL   OBJECTIVE:   DIAGNOSTIC FINDINGS:  Pt has has diagnostic testing at other facilities though PT unable to view any reports.  PATIENT SURVEYS:  Give FOTO next  visit.   COGNITION: Overall cognitive status: Within functional limits for tasks assessed      LUMBAR ROM:   AROM eval  Flexion 40%  Extension 25%  Right lateral flexion 60%  Left lateral flexion 60%  Right rotation 30%  Left rotation 25%    (Pt had pain with all AROM testing)  LOWER EXTREMITY MMT:      MMT  Right eval Left eval  Hip flexion 4-/5 4/5  Hip extension    Hip abduction    Hip adduction    Hip internal rotation    Hip external rotation 3+/5 4/5  Knee flexion 4-/5 seated 4/5 seated   Knee extension 4-/5 4-/5  Ankle dorsiflexion    Ankle plantarflexion Weak tested in sitting.  Weaker on R Weak tested in sitting  Ankle inversion    Ankle eversion     (Blank rows = not tested)    GAIT: Assistive device utilized: None Level of assistance: Complete Independence Comments: Slow, antalgic gait.  Pt c/o's of back pain which worsens down LE's with ambulation.   TODAY'S TREATMENT:                                                                                                                              Pt seen for aquatic therapy today.  Treatment took place in water 3.25-4.5 ft in depth at the [facility]. Temp of water was 91.  Pt entered/exited the pool via stairs with hand rail.   *Walking forward, back and side stepping 4.8 ft multiple widths *L stretch with tail wagging *Hip hinging VC, demonstration and manual assist for execution.  *Lumbar rotation *Plank on bench with hip extension R/L x7-8 R/L *forward and backward amb with yellow HB submerged to side *yellow HB and lt blue resistance band around knees; side lunges with ue add/abd *seated on noodle: posterior pelvic tilting and hip hiking for LB and pelvis mobilization *Yellow HB pull downs in wide stance then staggered for core engagement *standing using hand bells oscillating in sagittal plane intermittent 15s fast/slow, wide stance then staggered x 2 sets ea position for core  stabilization  Pt requires the buoyancy and hydrostatic pressure of water for support, and to offload joints by unweighting joint load by at least 50 % in navel deep water and by at least 75-80% in chest to neck deep water.  Viscosity of the water is needed for resistance of strengthening. Water current perturbations provides challenge to standing balance requiring increased core activation.    PATIENT EDUCATION:  Education details: Educated pt with aquatic therapy process and aquatic properties, benefits, and purpose.  Educated pt concerning POC, relevant anatomy, objective findings, and dx.  PT answered Pt's questions.  Person educated: Patient Education method: Explanation Education comprehension: verbalized understanding  HOME EXERCISE PROGRAM: Access Code: [email or web address] URL: [email or web address] Date: 02/21/2023 Prepared by: Denton Meek  Exercises - Quadruped Cat with Posterior Pelvic Tilt  - 1 x daily - 7 x weekly - 3 sets - 10 reps - Supine Pelvic Tilt  - 1 x daily - 7 x weekly - 3 sets - 10 reps - Standing Lumbar Spine Flexion Stretch Counter  - 1 x daily - 7 x weekly - 3 sets - 10 reps - Supine Lower Trunk Rotation  - 1 x daily - 7 x weekly - 3 sets - 10 reps  ASSESSMENT:  CLINICAL IMPRESSION: Pt edu on completing PT only 1 time a week will likely not improve him to his desired level of function.  He states he has a total gym I his garage and is using it.  I caution him to be careful not to exacerbated back pain. Encouraged waiting for land based appt to proper;ly address back pain and instruct on program.  He VU. Will re-cert him next session as he has only been seen x 3 in this past episode. Plan to transition him to land asap as I do believe he is more appropriate for land based than aquatic. He is given (land based) a HEP and instructed today for stretching as noted above. Instructed to complete 1-3 x daily for maximum benefit. Progressed core stretching and  strengthening which he tolerates well in lap pool. Reports reduction in pain a few points upon completion. No goals yet met. Goals ongoing      Initial Impression Patient is a 52 y.o. male with a dx of LBP presenting to the clinic with chronic LBP, limited ROM in lumbar, and bilat LE muscle weakness.  Pt c/o's of constant pain and reports his back gives out on him yearly.  He reports having sciatic pain down entire LE's daily.  He has to be careful with his movements and lifting.  Pt is limited with ambulation distance and states he freezes with ambulation occasionally.  Pt is limited with  bending and has increased pain with standing and sitting.  He has disturbed sleep every night.  Pt has orders for aquatic therapy.  Pt may benefit from skilled PT services to address impairments and to improve overall function.    OBJECTIVE IMPAIRMENTS: Abnormal gait, decreased activity tolerance, decreased endurance, decreased mobility, difficulty walking, decreased ROM, decreased strength, hypomobility, increased fascial restrictions, impaired flexibility, and pain.   ACTIVITY LIMITATIONS: carrying, lifting, bending, sitting, standing, squatting, sleeping, stairs, and locomotion level  PARTICIPATION LIMITATIONS: meal prep, cleaning, laundry, shopping, community activity, and occupation  PERSONAL FACTORS: Time since onset of injury/illness/exacerbation and 1-2 comorbidities: cervical/thoracic pain and depression  are also affecting patient's functional outcome.   REHAB POTENTIAL: Good  CLINICAL DECISION MAKING: Evolving/moderate complexity  EVALUATION COMPLEXITY: Moderate   GOALS:  SHORT TERM GOALS: Target date: 02/04/2023   Pt will tolerate aquatic therapy without adverse effects for improved tolerance to activity, pain, strength, and function.  Baseline: Goal status: INITIAL  2.  Pt will report at least a 25% improvement in pain and sx's overall.  Baseline:  Goal status: INITIAL  3.  Pt will  report at least a 25% decrease in freezing episodes with ambulation.   Baseline:  Goal status: INITIAL  4.  Pt will demo at least a 25% improvement in each direction of lumbar AROM for improved mobility.  Baseline:  Goal status: INITIAL  5.  Pt will ambulate with improved speed and reduced pain with ambulation.   Baseline:  Goal status: INITIAL Target date:  02/11/2023    LONG TERM GOALS: Target date: 02/25/2023  Pt will demo lumbar AROM to be [facility] t/o and have less pain with AROM testing for improved daily mobility.  Baseline:  Goal status: INITIAL  2.  Pt will demo improved strength in bilat hips and knees to at least 4+/5 MMT for improved tolerance with and performance of functional mobility.  Baseline:  Goal status: INITIAL  3.  Pt will be able to walk with wife and also ambulate extended community distance without significant pain.  Baseline:  Goal status: INITIAL  4.  Pt will report at least a 60-70% improvement in pain and sx's overall.  Baseline:  Goal status: INITIAL  5.  Pt will be independent with aquatic HEP and land based core HEP for improved tolerance to activity, functional mobility, pain, and strength.   Baseline:  Goal status: INITIAL  6.  Pt will demo proper body mechanics with squat to lift/hip hinge in order to perform functional lifting with reduced stress and strain on lumbar spine. Baseline:  Goal status: INITIAL  PLAN:  PT FREQUENCY: 2x/week  PT DURATION: 6 weeks  PLANNED INTERVENTIONS: Therapeutic exercises, Therapeutic activity, Neuromuscular re-education, Gait training, Patient/Family education, Self Care, Joint mobilization, Stair training, Aquatic Therapy, Dry Needling, Electrical stimulation, Spinal mobilization, Cryotherapy, Moist heat, Taping, Traction, Ultrasound, Manual therapy, and Re-evaluation.  PLAN FOR NEXT SESSION: Aquatic therapy for approx 3 weeks and then return to land.  Give FOTO next visit   Stanton Kidney Tharon Aquas) [place] Stroope  MPT 02/21/23 4:43 PM

## 2023-02-26 ENCOUNTER — Ambulatory Visit (HOSPITAL_BASED_OUTPATIENT_CLINIC_OR_DEPARTMENT_OTHER): Payer: POS | Admitting: Physical Therapy

## 2023-02-28 ENCOUNTER — Ambulatory Visit (HOSPITAL_BASED_OUTPATIENT_CLINIC_OR_DEPARTMENT_OTHER): Payer: No Typology Code available for payment source | Admitting: Physical Therapy

## 2023-02-28 ENCOUNTER — Encounter (HOSPITAL_BASED_OUTPATIENT_CLINIC_OR_DEPARTMENT_OTHER): Payer: Self-pay | Admitting: Physical Therapy

## 2023-02-28 DIAGNOSIS — R262 Difficulty in walking, not elsewhere classified: Secondary | ICD-10-CM

## 2023-02-28 DIAGNOSIS — M6281 Muscle weakness (generalized): Secondary | ICD-10-CM

## 2023-02-28 DIAGNOSIS — M5459 Other low back pain: Secondary | ICD-10-CM

## 2023-02-28 NOTE — Therapy (Signed)
OUTPATIENT PHYSICAL THERAPY THORACOLUMBAR   Progress Note Reporting Period 01/14/23 to 02/28/23  See note below for Objective Data and Assessment of Progress/Goals.     Patient Name: Christopher Freeman MRN: [id number] DOB:10/03/1971, 52 y.o., male Today's Date: 02/28/2023  END OF SESSION:  PT End of Session - 02/28/23 1618     Visit Number 4    Number of Visits 12    Date for PT Re-Evaluation 04/25/23    Authorization Type [facility]/[employer]    PT Start Time 1620    PT Stop Time 1700    PT Time Calculation (min) 40 min    Activity Tolerance Patient tolerated treatment well    Behavior During Therapy [facility] for tasks assessed/performed              History reviewed. No pertinent past medical history. History reviewed. No pertinent surgical history. There are no problems to display for this patient.   PCP: Charlsie Merles, MD  REFERRING PROVIDER: Charlsie Merles, MD  REFERRING DIAG: M54.50 (ICD-10-CM) - Low back pain, unspecified  Rationale for Evaluation and Treatment: Rehabilitation  THERAPY DIAG:  Muscle weakness (generalized)  Difficulty in walking, not elsewhere classified  Other low back pain  ONSET DATE: PT orders in August and December 2023  SUBJECTIVE:                                                                                                                                                                                           SUBJECTIVE STATEMENT: "Back is a little better.  Those stretches really help"   Pt reports his back pain began in boot camp om 1995.  He had significant pain when his back popped during sit ups.  He states L4-5 were damaged.  Pt states the next year his back went out on him again when he was bending to lift an objects, so much that he lost his breath.  He states his back gives out on him every year and he has to be very careful with his movements.  He reports no specific time of recent exacerbation.  He reports having sciatica  in bilat LE's. Pt's original PT order in August indicated aquatic therapy.  Pt went to a facility for PT though they didn't have aquatic therapy.  Pt only had an assessment performed, and was trying to find a PT clinic that had a pool for aquatic therapy.   Pt denies any lumbar surgery.  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  Pt has tried acupuncture which only helped for 20 mins.   Pt  has received some PT in the past, though has been awhile.     He is very limited with lifting objects and has to be careful with lifting.  Pt is limited with bending over.  Pt has difficulty sleeping every night.  Pt has excruciating pain with kneeling.  Pt is limited with walks with his wife.  His pain will freeze him with ambulation.  Pt has sciatica daily.  It can cause excruciating pain in bilat ankles which limits him ambulation.  He also has pain with standing and sitting.  He gets H/A's from the pain.  Easing factors:   "not a whole lot that makes it feel better".  TENS units.  medication helps some.  Theracane provides temporary relief.   PERTINENT HISTORY:  Chronic low back pain Hx of cervical pain, depression, HTN, and tinnitus  PAIN:  Are you having pain? Yes NPRS:  6-6.5/10 Current, 10/10 Worst, 5/10 Best Location:  bilat sides of lumbar.  Pain travels down bilat LE's from buttocks to ankles/feet.  He has tingling in feet.  Pt has thoracic pain also  PRECAUTIONS: None  WEIGHT BEARING RESTRICTIONS: No  FALLS:  Has patient fallen in last 6 months? 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   PLOF: Independent.  Pt has a hx of chronic pain which affect his daily activities and functional mobility.   PATIENT GOALS: reduce pain.  To be able to function to have a decent QOL   OBJECTIVE:   DIAGNOSTIC FINDINGS:  Pt has has diagnostic testing at other facilities though PT unable to view  any reports.  PATIENT SURVEYS:  Give FOTO next visit.   COGNITION: Overall cognitive status: Within functional limits for tasks assessed      LUMBAR ROM:   AROM eval   Flexion 40%   Extension 25%   Right lateral flexion 60%   Left lateral flexion 60%   Right rotation 30%   Left rotation 25%     (Pt had pain with all AROM testing)  LOWER EXTREMITY MMT:      MMT  Right eval Left eval  Hip flexion 4-/5 4/5  Hip extension    Hip abduction    Hip adduction    Hip internal rotation    Hip external rotation 3+/5 4/5  Knee flexion 4-/5 seated 4/5 seated   Knee extension 4-/5 4-/5  Ankle dorsiflexion    Ankle plantarflexion Weak tested in sitting.  Weaker on R Weak tested in sitting  Ankle inversion    Ankle eversion     (Blank rows = not tested)    GAIT: Assistive device utilized: None Level of assistance: Complete Independence Comments: Slow, antalgic gait.  Pt c/o's of back pain which worsens down LE's with ambulation.   TODAY'S TREATMENT:  Pt seen for aquatic therapy today.  Treatment took place in water 3.25-4.5 ft in depth at the [place]. Temp of water was 91.  Pt entered/exited the pool via stairs with hand rail.   *Walking forward, back and side stepping 4.8 ft multiple widths *L stretch with tail wagging *Old man stretch *Lumbar rotation *Cat/cow using thick square noodle *thoracic rotation *Hip hinging VC, demonstration and manual assist for execution.  *Plank on bench with hip extension R/L x7-8 R/L *forward and backward amb with yellow HB submerged to side * side lunges with ue add/abd yellow HB *seated on noodle: posterior pelvic tilting and hip hiking for LB and pelvis mobilization *Yellow noodle pull downs in wide stance then staggered for core engagement   Pt requires the buoyancy and hydrostatic  pressure of water for support, and to offload joints by unweighting joint load by at least 50 % in navel deep water and by at least 75-80% in chest to neck deep water.  Viscosity of the water is needed for resistance of strengthening. Water current perturbations provides challenge to standing balance requiring increased core activation.    PATIENT EDUCATION:  Education details: Educated pt with aquatic therapy process and aquatic properties, benefits, and purpose.  Educated pt concerning POC, relevant anatomy, objective findings, and dx.  PT answered Pt's questions.  Person educated: Patient Education method: Explanation Education comprehension: verbalized understanding  HOME EXERCISE PROGRAM: Access Code: [email or web address] URL: [email or web address] Date: 02/21/2023 Prepared by: Denton Meek  Exercises - Quadruped Cat with Posterior Pelvic Tilt  - 1 x daily - 7 x weekly - 3 sets - 10 reps - Supine Pelvic Tilt  - 1 x daily - 7 x weekly - 3 sets - 10 reps - Standing Lumbar Spine Flexion Stretch Counter  - 1 x daily - 7 x weekly - 3 sets - 10 reps - Supine Lower Trunk Rotation  - 1 x daily - 7 x weekly - 3 sets - 10 reps  ASSESSMENT:  CLINICAL IMPRESSION: Pt reports pain 4/10 prior to submersion then 6/10 submerged initially "I think because of the water pressure".  Post session 3/10.  He reports compliance with land based stretching which has reduced back pain through the week.  PN: Pt has only been seen for skilled PT x3 in aquatics, delay due to scheduling difficulties with his work place. He reports ~10% decrease in freezing and overall pain stating he avoids certain walking situations that may cause the freezing.  He is tolerating the aquatic sessions well and will continue to benefit from skilled physical therapy with transition onto land based as he is high level and more approp for that setting also he will/does not have pool access going forward. Pt only able to attend PT 1 x  week due to work schedule but has a [employer] at home which he can use to complete land based HEP. Will offer aquatic HEP and issue if pt desires next session.   OBJECTIVE IMPAIRMENTS: Abnormal gait, decreased activity tolerance, decreased endurance, decreased mobility, difficulty walking, decreased ROM, decreased strength, hypomobility, increased fascial restrictions, impaired flexibility, and pain.   ACTIVITY LIMITATIONS: carrying, lifting, bending, sitting, standing, squatting, sleeping, stairs, and locomotion level  PARTICIPATION LIMITATIONS: meal prep, cleaning, laundry, shopping, community activity, and occupation  PERSONAL FACTORS: Time since onset of injury/illness/exacerbation and 1-2 comorbidities: cervical/thoracic pain and depression  are also affecting patient's functional outcome.   REHAB POTENTIAL: Good  CLINICAL DECISION MAKING: Evolving/moderate complexity  EVALUATION COMPLEXITY: Moderate  GOALS:  SHORT TERM GOALS: Target date: 02/04/2023   Pt will tolerate aquatic therapy without adverse effects for improved tolerance to activity, pain, strength, and function.  Baseline: Goal status: Met 02/28/23  2.  Pt will report at least a 25% improvement in pain and sx's overall.  Baseline:  Goal status: Ongoing 02/28/23  3.  Pt will report at least a 25% decrease in freezing episodes with ambulation.   Baseline:  Goal status: ongoing 02/28/23  4.  Pt will demo at least a 25% improvement in each direction of lumbar AROM for improved mobility.  Baseline:  Goal status: ongoing 02/28/23  5.  Pt will ambulate with improved speed and reduced pain with ambulation.   Baseline:  Goal status: ongoing 02/28/23 Target date:  02/11/2023    LONG TERM GOALS: Target date: 04/25/23  Pt will demo lumbar AROM to be [facility] t/o and have less pain with AROM testing for improved daily mobility.  Baseline:  Goal status: INITIAL  2.  Pt will demo improved strength in bilat hips and knees to at  least 4+/5 MMT for improved tolerance with and performance of functional mobility.  Baseline:  Goal status: INITIAL  3.  Pt will be able to walk with wife and also ambulate extended community distance without significant pain.  Baseline:  Goal status: INITIAL  4.  Pt will report at least a 60-70% improvement in pain and sx's overall.  Baseline:  Goal status: INITIAL  5.  Pt will be independent with aquatic HEP and land based core HEP for improved tolerance to activity, functional mobility, pain, and strength.   Baseline:  Goal status: INITIAL  6.  Pt will demo proper body mechanics with squat to lift/hip hinge in order to perform functional lifting with reduced stress and strain on lumbar spine. Baseline:  Goal status: INITIAL  PLAN:  PT FREQUENCY: 1xweek  PT DURATION: 8 weeks  PLANNED INTERVENTIONS: Therapeutic exercises, Therapeutic activity, Neuromuscular re-education, Gait training, Patient/Family education, Self Care, Joint mobilization, Stair training, Aquatic Therapy, Dry Needling, Electrical stimulation, Spinal mobilization, Cryotherapy, Moist heat, Taping, Traction, Ultrasound, Manual therapy, and Re-evaluation.  PLAN FOR NEXT SESSION: Aquatic therapy for approx 3 weeks and then return to land.   Stanton Kidney Tharon Aquas) Santresa Levett MPT 02/28/23 5:11 PM

## 2023-03-05 ENCOUNTER — Ambulatory Visit (HOSPITAL_BASED_OUTPATIENT_CLINIC_OR_DEPARTMENT_OTHER): Payer: POS | Admitting: Physical Therapy

## 2023-03-06 NOTE — Therapy (Signed)
OUTPATIENT PHYSICAL THERAPY THORACOLUMBAR   Progress Note Reporting Period 01/14/23 to 02/28/23  See note below for Objective Data and Assessment of Progress/Goals.     Patient Name: Christopher Freeman MRN: [id number] DOB:[birth year], 52 y.o., male Today's Date: 02/28/2023  END OF SESSION:  PT End of Session - 02/28/23 1618     Visit Number 4    Number of Visits 12    Date for PT Re-Evaluation 04/25/23    Authorization Type VA/[employer]    PT Start Time 1620    PT Stop Time 1700    PT Time Calculation (min) 40 min    Activity Tolerance Patient tolerated treatment well    Behavior During Therapy [facility] for tasks assessed/performed              History reviewed. No pertinent past medical history. History reviewed. No pertinent surgical history. There are no problems to display for this patient.   PCP: Charlsie Merles, MD  REFERRING PROVIDER: Charlsie Merles, MD  REFERRING DIAG: M54.50 (ICD-10-CM) - Low back pain, unspecified  Rationale for Evaluation and Treatment: Rehabilitation  THERAPY DIAG:  Muscle weakness (generalized)  Difficulty in walking, not elsewhere classified  Other low back pain  ONSET DATE: PT orders in August and December 2023  SUBJECTIVE:                                                                                                                                                                                           SUBJECTIVE STATEMENT: "Back is a little better.  Those stretches really help"   Pt reports his back pain began in boot camp om 1995.  He had significant pain when his back popped during sit ups.  He states L4-5 were damaged.  Pt states the next year his back went out on him again when he was bending to lift an objects, so much that he lost his breath.  He states his back gives out on him every year and he has to be very careful with his movements.  He reports no specific time of recent exacerbation.  He reports having sciatica  in bilat LE's. Pt's original PT order in August indicated aquatic therapy.  Pt went to a facility for PT though they didn't have aquatic therapy.  Pt only had an assessment performed, and was trying to find a PT clinic that had a pool for aquatic therapy.   Pt denies any lumbar surgery.  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  Pt has tried acupuncture which only helped for 20 mins.   Pt  has received some PT in the past, though has been awhile.     He is very limited with lifting objects and has to be careful with lifting.  Pt is limited with bending over.  Pt has difficulty sleeping every night.  Pt has excruciating pain with kneeling.  Pt is limited with walks with his wife.  His pain will freeze him with ambulation.  Pt has sciatica daily.  It can cause excruciating pain in bilat ankles which limits him ambulation.  He also has pain with standing and sitting.  He gets H/A's from the pain.  Easing factors:   "not a whole lot that makes it feel better".  TENS units.  medication helps some.  Theracane provides temporary relief.   PERTINENT HISTORY:  Chronic low back pain Hx of cervical pain, depression, HTN, and tinnitus  PAIN:  Are you having pain? Yes NPRS:  6-6.5/10 Current, 10/10 Worst, 5/10 Best Location:  bilat sides of lumbar.  Pain travels down bilat LE's from buttocks to ankles/feet.  He has tingling in feet.  Pt has thoracic pain also  PRECAUTIONS: None  WEIGHT BEARING RESTRICTIONS: No  FALLS:  Has patient fallen in last 6 months? 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   PLOF: Independent.  Pt has a hx of chronic pain which affect his daily activities and functional mobility.   PATIENT GOALS: reduce pain.  To be able to function to have a decent QOL   OBJECTIVE:   DIAGNOSTIC FINDINGS:  Pt has has diagnostic testing at other facilities though PT unable to view  any reports.  PATIENT SURVEYS:  Give FOTO next visit.   COGNITION: Overall cognitive status: Within functional limits for tasks assessed      LUMBAR ROM:   AROM eval   Flexion 40%   Extension 25%   Right lateral flexion 60%   Left lateral flexion 60%   Right rotation 30%   Left rotation 25%     (Pt had pain with all AROM testing)  LOWER EXTREMITY MMT:      MMT  Right eval Left eval  Hip flexion 4-/5 4/5  Hip extension    Hip abduction    Hip adduction    Hip internal rotation    Hip external rotation 3+/5 4/5  Knee flexion 4-/5 seated 4/5 seated   Knee extension 4-/5 4-/5  Ankle dorsiflexion    Ankle plantarflexion Weak tested in sitting.  Weaker on R Weak tested in sitting  Ankle inversion    Ankle eversion     (Blank rows = not tested)    GAIT: Assistive device utilized: None Level of assistance: Complete Independence Comments: Slow, antalgic gait.  Pt c/o's of back pain which worsens down LE's with ambulation.   TODAY'S TREATMENT:  Pt seen for aquatic therapy today.  Treatment took place in water 3.25-4.5 ft in depth at the [place]. Temp of water was 91.  Pt entered/exited the pool via stairs with hand rail.   *Walking forward, back and side stepping 4.8 ft multiple widths *L stretch with tail wagging *Old man stretch *Lumbar rotation *Cat/cow using thick square noodle *thoracic rotation *Hip hinging VC, demonstration and manual assist for execution.  *Plank on bench with hip extension R/L x7-8 R/L *forward and backward amb with yellow HB submerged to side * side lunges with ue add/abd yellow HB *seated on noodle: posterior pelvic tilting and hip hiking for LB and pelvis mobilization *Yellow noodle pull downs in wide stance then staggered for core engagement   Pt requires the buoyancy and hydrostatic  pressure of water for support, and to offload joints by unweighting joint load by at least 50 % in navel deep water and by at least 75-80% in chest to neck deep water.  Viscosity of the water is needed for resistance of strengthening. Water current perturbations provides challenge to standing balance requiring increased core activation.    PATIENT EDUCATION:  Education details: Educated pt with aquatic therapy process and aquatic properties, benefits, and purpose.  Educated pt concerning POC, relevant anatomy, objective findings, and dx.  PT answered Pt's questions.  Person educated: Patient Education method: Explanation Education comprehension: verbalized understanding  HOME EXERCISE PROGRAM: Access Code: [email or web address] URL: [email or web address] Date: 02/21/2023 Prepared by: Denton Meek  Exercises - Quadruped Cat with Posterior Pelvic Tilt  - 1 x daily - 7 x weekly - 3 sets - 10 reps - Supine Pelvic Tilt  - 1 x daily - 7 x weekly - 3 sets - 10 reps - Standing Lumbar Spine Flexion Stretch Counter  - 1 x daily - 7 x weekly - 3 sets - 10 reps - Supine Lower Trunk Rotation  - 1 x daily - 7 x weekly - 3 sets - 10 reps  ASSESSMENT:  CLINICAL IMPRESSION: Pt reports pain 4/10 prior to submersion then 6/10 submerged initially "I think because of the water pressure".  Post session 3/10.  He reports compliance with land based stretching which has reduced back pain through the week.  PN: Pt has only been seen for skilled PT x3 in aquatics, delay due to scheduling difficulties with his work place. He reports ~10% decrease in freezing and overall pain stating he avoids certain walking situations that may cause the freezing.  He is tolerating the aquatic sessions well and will continue to benefit from skilled physical therapy with transition onto land based as he is high level and more approp for that setting also he will/does not have pool access going forward. Pt only able to attend PT 1 x  week due to work schedule but has a [employer] at home which he can use to complete land based HEP. Will offer aquatic HEP and issue if pt desires next session.   OBJECTIVE IMPAIRMENTS: Abnormal gait, decreased activity tolerance, decreased endurance, decreased mobility, difficulty walking, decreased ROM, decreased strength, hypomobility, increased fascial restrictions, impaired flexibility, and pain.   ACTIVITY LIMITATIONS: carrying, lifting, bending, sitting, standing, squatting, sleeping, stairs, and locomotion level  PARTICIPATION LIMITATIONS: meal prep, cleaning, laundry, shopping, community activity, and occupation  PERSONAL FACTORS: Time since onset of injury/illness/exacerbation and 1-2 comorbidities: cervical/thoracic pain and depression  are also affecting patient's functional outcome.   REHAB POTENTIAL: Good  CLINICAL DECISION MAKING: Evolving/moderate complexity  EVALUATION COMPLEXITY: Moderate  GOALS:  SHORT TERM GOALS: Target date: 02/04/2023   Pt will tolerate aquatic therapy without adverse effects for improved tolerance to activity, pain, strength, and function.  Baseline: Goal status: Met 02/28/23  2.  Pt will report at least a 25% improvement in pain and sx's overall.  Baseline:  Goal status: Ongoing 02/28/23  3.  Pt will report at least a 25% decrease in freezing episodes with ambulation.   Baseline:  Goal status: ongoing 02/28/23  4.  Pt will demo at least a 25% improvement in each direction of lumbar AROM for improved mobility.  Baseline:  Goal status: ongoing 02/28/23  5.  Pt will ambulate with improved speed and reduced pain with ambulation.   Baseline:  Goal status: ongoing 02/28/23 Target date:  02/11/2023    LONG TERM GOALS: Target date: 04/25/23  Pt will demo lumbar AROM to be [facility] t/o and have less pain with AROM testing for improved daily mobility.  Baseline:  Goal status: INITIAL  2.  Pt will demo improved strength in bilat hips and knees to at  least 4+/5 MMT for improved tolerance with and performance of functional mobility.  Baseline:  Goal status: INITIAL  3.  Pt will be able to walk with wife and also ambulate extended community distance without significant pain.  Baseline:  Goal status: INITIAL  4.  Pt will report at least a 60-70% improvement in pain and sx's overall.  Baseline:  Goal status: INITIAL  5.  Pt will be independent with aquatic HEP and land based core HEP for improved tolerance to activity, functional mobility, pain, and strength.   Baseline:  Goal status: INITIAL  6.  Pt will demo proper body mechanics with squat to lift/hip hinge in order to perform functional lifting with reduced stress and strain on lumbar spine. Baseline:  Goal status: INITIAL  PLAN:  PT FREQUENCY: 1xweek  PT DURATION: 8 weeks  PLANNED INTERVENTIONS: Therapeutic exercises, Therapeutic activity, Neuromuscular re-education, Gait training, Patient/Family education, Self Care, Joint mobilization, Stair training, Aquatic Therapy, Dry Needling, Electrical stimulation, Spinal mobilization, Cryotherapy, Moist heat, Taping, Traction, Ultrasound, Manual therapy, and Re-evaluation.  PLAN FOR NEXT SESSION: Aquatic therapy for approx 3 weeks and then return to land.   Stanton Kidney Tharon Aquas) Trinity Hyland MPT 02/28/23 5:11 PM

## 2023-03-07 ENCOUNTER — Encounter (HOSPITAL_BASED_OUTPATIENT_CLINIC_OR_DEPARTMENT_OTHER): Payer: Self-pay | Admitting: Physical Therapy

## 2023-03-07 ENCOUNTER — Ambulatory Visit (HOSPITAL_BASED_OUTPATIENT_CLINIC_OR_DEPARTMENT_OTHER): Payer: No Typology Code available for payment source | Admitting: Physical Therapy

## 2023-03-07 DIAGNOSIS — M6281 Muscle weakness (generalized): Secondary | ICD-10-CM

## 2023-03-07 DIAGNOSIS — M5459 Other low back pain: Secondary | ICD-10-CM

## 2023-03-07 DIAGNOSIS — R262 Difficulty in walking, not elsewhere classified: Secondary | ICD-10-CM

## 2023-03-21 ENCOUNTER — Encounter (HOSPITAL_BASED_OUTPATIENT_CLINIC_OR_DEPARTMENT_OTHER): Payer: Self-pay

## 2023-03-21 ENCOUNTER — Ambulatory Visit (HOSPITAL_BASED_OUTPATIENT_CLINIC_OR_DEPARTMENT_OTHER): Payer: Non-veteran care | Attending: Internal Medicine

## 2023-03-21 DIAGNOSIS — R262 Difficulty in walking, not elsewhere classified: Secondary | ICD-10-CM | POA: Diagnosis present

## 2023-03-21 DIAGNOSIS — M5459 Other low back pain: Secondary | ICD-10-CM | POA: Insufficient documentation

## 2023-03-21 DIAGNOSIS — M6281 Muscle weakness (generalized): Secondary | ICD-10-CM | POA: Diagnosis present

## 2023-03-21 NOTE — Therapy (Signed)
OUTPATIENT PHYSICAL THERAPY THORACOLUMBAR      Patient Name: Christopher Freeman MRN: [id number] DOB:07-17-1971, 52 y.o., male Today's Date: 03/22/2023  END OF SESSION:  PT End of Session - 03/21/23 1622     Visit Number 6    Number of Visits 12    Date for PT Re-Evaluation 04/25/23    PT Start Time 1625    PT Stop Time 1652    PT Time Calculation (min) 27 min    Activity Tolerance Patient tolerated treatment well    Behavior [facility] assessed/performed              History reviewed. No pertinent past medical history. History reviewed. No pertinent surgical history. There are no problems to display for this patient.   PCP: Sherwood GamblerBorum, Stephanie Y, MD  REFERRING PROVIDER: Sherwood GamblerBorum, Stephanie Y, MD  REFERRING DIAG: M54.50 (ICD-10-CM) - Low back pain, unspecified  Rationale for Evaluation and Treatment: Rehabilitation  THERAPY DIAG:  Difficulty in walking, not elsewhere classified  Muscle weakness (generalized)  Other low back pain  ONSET DATE: PT orders in August and December 2023  SUBJECTIVE:                                                                                                                                                                                           SUBJECTIVE STATEMENT: 7/10 pain level at entry, which is typical for pt.    Pt reports his back pain began in [employer] om 1995.  He had significant pain when his back popped during sit ups.  He states L4-5 were damaged.  Pt states the next year his back went out on him again when he was bending to lift an objects, so much that he lost his breath.  He states his back gives out on him every year and he has to be very careful with his movements.  He reports no specific time of recent exacerbation.  He reports having sciatica in bilat LE's. Pt's original PT order in August indicated aquatic therapy.  Pt went to a facility for PT though they didn't have aquatic therapy.  Pt only had  an assessment performed, and was trying to find a PT clinic that had a pool for aquatic therapy.   Pt denies any lumbar surgery.  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  Pt has tried acupuncture which only helped for 20 mins.   Pt has received some PT in the past, though has been awhile.     He is very limited with lifting objects and has  to be careful with lifting.  Pt is limited with bending over.  Pt has difficulty sleeping every night.  Pt has excruciating pain with kneeling.  Pt is limited with walks with his wife.  His pain will freeze him with ambulation.  Pt has sciatica daily.  It can cause excruciating pain in bilat ankles which limits him ambulation.  He also has pain with standing and sitting.  He gets H/A's from the pain.  Easing factors:   "not a whole lot that makes it feel better".  TENS units.  medication helps some.  Theracane provides temporary relief.   PERTINENT HISTORY:  Chronic low back pain Hx of cervical pain, depression, HTN, and tinnitus  PAIN:  Are you having pain? Yes NPRS:  6-6.5/10 Current, 10/10 Worst, 5/10 Best Location:  bilat sides of lumbar.  Pain travels down bilat LE's from buttocks to ankles/feet.  He has tingling in feet.  Pt has thoracic pain also  PRECAUTIONS: None  WEIGHT BEARING RESTRICTIONS: No  FALLS:  Has patient fallen in last 6 months? 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   PLOF: Independent.  Pt has a hx of chronic pain which affect his daily activities and functional mobility.   PATIENT GOALS: reduce pain.  To be able to function to have a decent QOL   OBJECTIVE:   DIAGNOSTIC FINDINGS:  Pt has has diagnostic testing at other facilities though PT unable to view any reports.  PATIENT SURVEYS:  Give FOTO next visit.   COGNITION: Overall cognitive status: Within functional limits for tasks assessed      LUMBAR  ROM:   AROM eval   Flexion 40%   Extension 25%   Right lateral flexion 60%   Left lateral flexion 60%   Right rotation 30%   Left rotation 25%     (Pt had pain with all AROM testing)  LOWER EXTREMITY MMT:      MMT  Right eval Left eval  Hip flexion 4-/5 4/5  Hip extension    Hip abduction    Hip adduction    Hip internal rotation    Hip external rotation 3+/5 4/5  Knee flexion 4-/5 seated 4/5 seated   Knee extension 4-/5 4-/5  Ankle dorsiflexion    Ankle plantarflexion Weak tested in sitting.  Weaker on R Weak tested in sitting  Ankle inversion    Ankle eversion     (Blank rows = not tested)    GAIT: Assistive device utilized: None Level of assistance: Complete Independence Comments: Slow, antalgic gait.  Pt c/o's of back pain which worsens down LE's with ambulation.   TODAY'S TREATMENT:                                                                                                                               03/21/23 -Manual stretching of HS, piriformis, fig  4 bilaterally STM R lumbar ps and glutes in sidelying -Hooklying PPT 5" 2x10 -LTR x10ea -S/L hip abduction x10ea   PATIENT EDUCATION:  Education details: Educated pt with aquatic therapy process and aquatic properties, benefits, and purpose.  Educated pt concerning POC, relevant anatomy, objective findings, and dx.  PT answered Pt's questions.  Person educated: Patient Education method: Explanation Education comprehension: verbalized understanding  HOME EXERCISE PROGRAM: Access Code: [email or web address] URL: [email or web address] Date: 02/21/2023 Prepared by: Geni Bers  Exercises - Quadruped Cat with Posterior Pelvic Tilt  - 1 x daily - 7 x weekly - 3 sets - 10 reps - Supine Pelvic Tilt  - 1 x daily - 7 x weekly - 3 sets - 10 reps - Standing Lumbar Spine Flexion Stretch Counter  - 1 x daily - 7 x weekly - 3 sets - 10 reps - Supine Lower Trunk Rotation  - 1 x daily - 7 x weekly - 3 sets -  10 reps  ASSESSMENT:  CLINICAL IMPRESSION: Tx time limited due to pt arriving 25 min late. Significant resptriciton in lumbar and thoracic paraspinlas palpated with R>L. He may benefit from trial of dry needling. Worked on gentle core stabilization, stretching, and hip strengthneing today with fair/good tolerance. Pt appears to work throuhg pain levels. Educated pt on exercise intensity at home and self MFR using tennis ball or foam roll. Pt will benefit from continued PT to address severe soft tissue restrictions, guarding, and muscle weakness/mobility deficit.    PN: Pt has only been seen for skilled PT x3 in aquatics, delay due to scheduling difficulties with his work place. He reports ~10% decrease in freezing and overall pain stating he avoids certain walking situations that may cause the freezing.  He is tolerating the aquatic sessions well and will continue to benefit from skilled physical therapy with transition onto land based as he is high level and more approp for that setting also he will/does not have pool access going forward. Pt only able to attend PT 1 x week due to work schedule but has a [employer] at home which he can use to complete land based HEP. Will offer aquatic HEP and issue if pt desires next session.   OBJECTIVE IMPAIRMENTS: Abnormal gait, decreased activity tolerance, decreased endurance, decreased mobility, difficulty walking, decreased ROM, decreased strength, hypomobility, increased fascial restrictions, impaired flexibility, and pain.   ACTIVITY LIMITATIONS: carrying, lifting, bending, sitting, standing, squatting, sleeping, stairs, and locomotion level  PARTICIPATION LIMITATIONS: meal prep, cleaning, laundry, shopping, community activity, and occupation  PERSONAL FACTORS: Time since onset of injury/illness/exacerbation and 1-2 comorbidities: cervical/thoracic pain and depression  are also affecting patient's functional outcome.   REHAB POTENTIAL: Good  CLINICAL  DECISION MAKING: Evolving/moderate complexity  EVALUATION COMPLEXITY: Moderate   GOALS:  SHORT TERM GOALS: Target date: 02/04/2023   Pt will tolerate aquatic therapy without adverse effects for improved tolerance to activity, pain, strength, and function.  Baseline: Goal status: Met 02/28/23  2.  Pt will report at least a 25% improvement in pain and sx's overall.  Baseline:  Goal status: Ongoing 02/28/23  3.  Pt will report at least a 25% decrease in freezing episodes with ambulation.   Baseline:  Goal status: ongoing 02/28/23  4.  Pt will demo at least a 25% improvement in each direction of lumbar AROM for improved mobility.  Baseline:  Goal status: ongoing 02/28/23  5.  Pt will ambulate with improved speed and reduced pain with ambulation.   Baseline:  Goal status:  ongoing 02/28/23 Target date:  02/11/2023    LONG TERM GOALS: Target date: 04/25/23  Pt will demo lumbar AROM to be [facility] t/o and have less pain with AROM testing for improved daily mobility.  Baseline:  Goal status: INITIAL  2.  Pt will demo improved strength in bilat hips and knees to at least 4+/5 MMT for improved tolerance with and performance of functional mobility.  Baseline:  Goal status: INITIAL  3.  Pt will be able to walk with wife and also ambulate extended community distance without significant pain.  Baseline:  Goal status: INITIAL  4.  Pt will report at least a 60-70% improvement in pain and sx's overall.  Baseline:  Goal status: INITIAL  5.  Pt will be independent with aquatic HEP and land based core HEP for improved tolerance to activity, functional mobility, pain, and strength.   Baseline:  Goal status: INITIAL  6.  Pt will demo proper body mechanics with squat to lift/hip hinge in order to perform functional lifting with reduced stress and strain on lumbar spine. Baseline:  Goal status: INITIAL  PLAN:  PT FREQUENCY: 1xweek  PT DURATION: 8 weeks  PLANNED INTERVENTIONS: Therapeutic  exercises, Therapeutic activity, Neuromuscular re-education, Gait training, Patient/Family education, Self Care, Joint mobilization, Stair training, Aquatic Therapy, Dry Needling, Electrical stimulation, Spinal mobilization, Cryotherapy, Moist heat, Taping, Traction, Ultrasound, Manual therapy, and Re-evaluation.  PLAN FOR NEXT SESSION: Return to land based intervention   Riki Altes, PTA  03/22/23 7:54 AM

## 2023-03-28 ENCOUNTER — Encounter (HOSPITAL_BASED_OUTPATIENT_CLINIC_OR_DEPARTMENT_OTHER): Payer: POS | Admitting: Physical Therapy

## 2023-04-04 ENCOUNTER — Ambulatory Visit (HOSPITAL_BASED_OUTPATIENT_CLINIC_OR_DEPARTMENT_OTHER): Payer: No Typology Code available for payment source | Admitting: Physical Therapy

## 2023-04-04 DIAGNOSIS — R262 Difficulty in walking, not elsewhere classified: Secondary | ICD-10-CM | POA: Diagnosis not present

## 2023-04-04 DIAGNOSIS — M6281 Muscle weakness (generalized): Secondary | ICD-10-CM

## 2023-04-04 DIAGNOSIS — M5459 Other low back pain: Secondary | ICD-10-CM

## 2023-04-04 NOTE — Therapy (Signed)
OUTPATIENT PHYSICAL THERAPY THORACOLUMBAR      Patient Name: Christopher Freeman MRN: [id number] DOB:Dec 04, 1971, 52 y.o., male Today's Date: 04/06/2023  END OF SESSION:  PT End of Session - 04/04/23 1707     Visit Number 7    Number of Visits 12    Date for PT Re-Evaluation 04/25/23    Authorization Type [employer]    PT Start Time 1625    PT Stop Time 1700    PT Time Calculation (min) 35 min    Activity Tolerance Patient tolerated treatment well    Behavior During Therapy [facility] for tasks assessed/performed               History reviewed. No pertinent past medical history. History reviewed. No pertinent surgical history. There are no problems to display for this patient.   PCP: Sherwood Gambler, MD  REFERRING PROVIDER: Sherwood Gambler, MD  REFERRING DIAG: M54.50 (ICD-10-CM) - Low back pain, unspecified  Rationale for Evaluation and Treatment: Rehabilitation  THERAPY DIAG:  Other low back pain  Muscle weakness (generalized)  Difficulty in walking, not elsewhere classified  ONSET DATE: PT orders in August and December 2023  SUBJECTIVE:                                                                                                                                                                                           SUBJECTIVE STATEMENT: Pt thinks aquatic therapy helped.  Pt reports he has improved confidence with trying things including more mobility.  Pt is trying more stretches to help with the pain which in turn helps with movement.  Pt reports he has a fear of being injured due to his hx of pain.  Pt is limited with walks with his wife.  "I haven't walked a lot."  He is very limited with lifting objects and has to be careful with lifting.  Pt is limited with bending over.  Pt has difficulty sleeping every night.  Pt states he was sore and tired with a little more pain after prior Rx.     PERTINENT HISTORY:  Chronic low back pain Hx of cervical pain,  depression, HTN, and tinnitus  PAIN:  Are you having pain? Yes NPRS:  6/10 Current, 10/10 Worst, 5/10 Best Location:  bilat sides of lumbar and central lumbar  PRECAUTIONS: None  WEIGHT BEARING RESTRICTIONS: No  FALLS:  Has patient fallen in last 6 months? 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   PLOF: Independent.  Pt has a hx of chronic pain which  affect his daily activities and functional mobility.   PATIENT GOALS: reduce pain.  To be able to function to have a decent QOL   OBJECTIVE:   DIAGNOSTIC FINDINGS:  Pt has has diagnostic testing at other facilities though PT unable to view any reports.   TODAY'S TREATMENT:                                                                                                                               Reviewed current function, response to prior rx, and pain level.   Pt was instructed in palpation and performance of TrA contraction.  Pt performed supine TrA contraction with and without 5 sec hold Supine clams with TrA 2x10 PPT x10 Lumbar rotation x10 reps Supine manual HS stretch 2x20-30 sec bilat  Manual Therapy: Pt received STM to bilat lumbar paraspinals and R glute in L S/L'ing to improve pain, soft tissue tightness, and mobility.   PATIENT EDUCATION:  Education details: Educated pt with aquatic therapy process and aquatic properties, benefits, and purpose.  Educated pt concerning POC, relevant anatomy, objective findings, and dx.  PT answered Pt's questions.  Person educated: Patient Education method: Explanation Education comprehension: verbalized understanding  HOME EXERCISE PROGRAM: Access Code: [email or web address] URL: [email or web address] Date: 02/21/2023 Prepared by: Geni Bers  Exercises - Quadruped Cat with Posterior Pelvic Tilt  - 1 x daily - 7 x weekly - 3 sets - 10 reps - Supine Pelvic Tilt  - 1 x daily - 7 x weekly - 3 sets - 10 reps -  Standing Lumbar Spine Flexion Stretch Counter  - 1 x daily - 7 x weekly - 3 sets - 10 reps - Supine Lower Trunk Rotation  - 1 x daily - 7 x weekly - 3 sets - 10 reps  ASSESSMENT:  CLINICAL IMPRESSION: Pt presents to PT for a land based Rx.  PT focused on lumbar mobility, LE flexibility, and core strengthening including educating pt with TrA contraction.  Pt performed core exercises well with cuing and instruction in correct form.  Pt does have tightness in bilat lumbar paraspinals.  Pt reports minimally improved pain to 5.5/10 after Rx.  Pt may benefit from continued PT to address impairments and ongoing goals and to improve overall function. .     OBJECTIVE IMPAIRMENTS: Abnormal gait, decreased activity tolerance, decreased endurance, decreased mobility, difficulty walking, decreased ROM, decreased strength, hypomobility, increased fascial restrictions, impaired flexibility, and pain.   ACTIVITY LIMITATIONS: carrying, lifting, bending, sitting, standing, squatting, sleeping, stairs, and locomotion level  PARTICIPATION LIMITATIONS: meal prep, cleaning, laundry, shopping, community activity, and occupation  PERSONAL FACTORS: Time since onset of injury/illness/exacerbation and 1-2 comorbidities: cervical/thoracic pain and depression  are also affecting patient's functional outcome.   REHAB POTENTIAL: Good  CLINICAL DECISION MAKING: Evolving/moderate complexity  EVALUATION COMPLEXITY: Moderate   GOALS:  SHORT TERM GOALS: Target date: 02/04/2023   Pt will tolerate aquatic therapy without adverse effects for improved tolerance to activity,  pain, strength, and function.  Baseline: Goal status: Met 02/28/23  2.  Pt will report at least a 25% improvement in pain and sx's overall.  Baseline:  Goal status: Ongoing 02/28/23  3.  Pt will report at least a 25% decrease in freezing episodes with ambulation.   Baseline:  Goal status: ongoing 02/28/23  4.  Pt will demo at least a 25%  improvement in each direction of lumbar AROM for improved mobility.  Baseline:  Goal status: ongoing 02/28/23  5.  Pt will ambulate with improved speed and reduced pain with ambulation.   Baseline:  Goal status: ongoing 02/28/23 Target date:  02/11/2023    LONG TERM GOALS: Target date: 04/25/23  Pt will demo lumbar AROM to be [facility] t/o and have less pain with AROM testing for improved daily mobility.  Baseline:  Goal status: INITIAL  2.  Pt will demo improved strength in bilat hips and knees to at least 4+/5 MMT for improved tolerance with and performance of functional mobility.  Baseline:  Goal status: INITIAL  3.  Pt will be able to walk with wife and also ambulate extended community distance without significant pain.  Baseline:  Goal status: INITIAL  4.  Pt will report at least a 60-70% improvement in pain and sx's overall.  Baseline:  Goal status: INITIAL  5.  Pt will be independent with aquatic HEP and land based core HEP for improved tolerance to activity, functional mobility, pain, and strength.   Baseline:  Goal status: INITIAL  6.  Pt will demo proper body mechanics with squat to lift/hip hinge in order to perform functional lifting with reduced stress and strain on lumbar spine. Baseline:  Goal status: INITIAL  PLAN:  PT FREQUENCY: 1xweek  PT DURATION: 8 weeks  PLANNED INTERVENTIONS: Therapeutic exercises, Therapeutic activity, Neuromuscular re-education, Gait training, Patient/Family education, Self Care, Joint mobilization, Stair training, Aquatic Therapy, Dry Needling, Electrical stimulation, Spinal mobilization, Cryotherapy, Moist heat, Taping, Traction, Ultrasound, Manual therapy, and Re-evaluation.  PLAN FOR NEXT SESSION: Cont with core and LE strengthening, lumbar mobility, LE flexibility, and manual therapy   Audie Clear III PT, DPT 04/06/23 12:57 AM

## 2023-04-06 ENCOUNTER — Encounter (HOSPITAL_BASED_OUTPATIENT_CLINIC_OR_DEPARTMENT_OTHER): Payer: Self-pay | Admitting: Physical Therapy

## 2023-04-11 ENCOUNTER — Ambulatory Visit (HOSPITAL_BASED_OUTPATIENT_CLINIC_OR_DEPARTMENT_OTHER): Payer: No Typology Code available for payment source | Attending: Internal Medicine | Admitting: Physical Therapy

## 2023-04-11 DIAGNOSIS — M5459 Other low back pain: Secondary | ICD-10-CM | POA: Insufficient documentation

## 2023-04-11 DIAGNOSIS — R262 Difficulty in walking, not elsewhere classified: Secondary | ICD-10-CM | POA: Diagnosis present

## 2023-04-11 DIAGNOSIS — M6281 Muscle weakness (generalized): Secondary | ICD-10-CM | POA: Diagnosis present

## 2023-04-11 NOTE — Therapy (Signed)
OUTPATIENT PHYSICAL THERAPY THORACOLUMBAR      Patient Name: Christopher Freeman MRN: [id number] DOB:12-24-1970, 52 y.o., male Today's Date: 04/12/2023  END OF SESSION:  PT End of Session - 04/11/23 1721     Visit Number 8    Number of Visits 12    Date for PT Re-Evaluation 04/25/23    Authorization Type [facility]/[employer]    PT Start Time 1629    PT Stop Time 1711    PT Time Calculation (min) 42 min    Activity Tolerance Patient tolerated treatment well    Behavior During Therapy [facility] for tasks assessed/performed                History reviewed. No pertinent past medical history. History reviewed. No pertinent surgical history. There are no problems to display for this patient.   PCP: Sherwood Gambler, MD  REFERRING PROVIDER: Sherwood Gambler, MD  REFERRING DIAG: M54.50 (ICD-10-CM) - Low back pain, unspecified  Rationale for Evaluation and Treatment: Rehabilitation  THERAPY DIAG:  Other low back pain  Muscle weakness (generalized)  Difficulty in walking, not elsewhere classified  ONSET DATE: PT orders in August and December 2023  SUBJECTIVE:                                                                                                                                                                                           SUBJECTIVE STATEMENT: Pt states last PT session helped.  He states his sciatica has been bothering him today.  Pt has been performing his HEP and states he feels better after performing HEP.  When he is having pain, the exercises help.       PERTINENT HISTORY:  Chronic low back pain Hx of cervical pain, depression, HTN, and tinnitus  PAIN:  Are you having pain? Yes NPRS:  6/10 /  8/10 Location:  lumbar / sciatica in bilat LE's  PRECAUTIONS: None  WEIGHT BEARING RESTRICTIONS: No  FALLS:  Has patient fallen in last 6 months? 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    PLOF: Independent.  Pt has a hx of chronic pain which affect his daily activities and functional mobility.   PATIENT GOALS: reduce pain.  To be able to function to have a decent QOL   OBJECTIVE:   DIAGNOSTIC FINDINGS:  Pt has has diagnostic testing at other facilities though PT unable to view any reports.   TODAY'S TREATMENT:  Reviewed current function, response to prior rx, and pain level.   PPT 2x10 Pt was instructed in palpation and performance of TrA contraction.  Pt performed supine TrA contraction with 5 sec hold Supine marching with TrA contraction x 10 reps Supine alt LE ext with TrA x10  Supine clams with PPT with TrA 2x10 Lumbar rotation 2x10 reps Standing rows with GTB with TrA 2x10 Supine piriformis stretch  3x30 sec bilat Supine manual HS stretch 3x30 sec bilat  Manual Therapy: Pt received STM to bilat lumbar paraspinals and R glute in L S/L'ing to improve pain, soft tissue tightness, and mobility.   PATIENT EDUCATION:  Education details: exercise form and rationale, POC, andrelevant anatomy  Person educated: Patient Education method: Explanation, demonstration, verbal and tactile cues Education comprehension: verbalized understanding, returned demonstration, verbal and tactile cues  HOME EXERCISE PROGRAM: Access Code: [email or web address] URL: [email or web address] Date: 02/21/2023 Prepared by: Geni Bers  Exercises - Quadruped Cat with Posterior Pelvic Tilt  - 1 x daily - 7 x weekly - 3 sets - 10 reps - Supine Pelvic Tilt  - 1 x daily - 7 x weekly - 3 sets - 10 reps - Standing Lumbar Spine Flexion Stretch Counter  - 1 x daily - 7 x weekly - 3 sets - 10 reps - Supine Lower Trunk Rotation  - 1 x daily - 7 x weekly - 3 sets - 10 reps  ASSESSMENT:  CLINICAL IMPRESSION: PT progressed core exercises today and pt performed  exercises well.  Pt has good form with TrA contractions.  He required minimal cuing for PPT.  Pt tolerated exercises well.  He responded well to Rx stating he felt better after STM and ther ex.  He reports reduced tightness after STM and reports minimally improved pain from 6/10 to 5/10 after Rx.  Pt may benefit from continued PT to address impairments and ongoing goals and to improve overall function.     OBJECTIVE IMPAIRMENTS: Abnormal gait, decreased activity tolerance, decreased endurance, decreased mobility, difficulty walking, decreased ROM, decreased strength, hypomobility, increased fascial restrictions, impaired flexibility, and pain.   ACTIVITY LIMITATIONS: carrying, lifting, bending, sitting, standing, squatting, sleeping, stairs, and locomotion level  PARTICIPATION LIMITATIONS: meal prep, cleaning, laundry, shopping, community activity, and occupation  PERSONAL FACTORS: Time since onset of injury/illness/exacerbation and 1-2 comorbidities: cervical/thoracic pain and depression  are also affecting patient's functional outcome.   REHAB POTENTIAL: Good  CLINICAL DECISION MAKING: Evolving/moderate complexity  EVALUATION COMPLEXITY: Moderate   GOALS:  SHORT TERM GOALS: Target date: 02/04/2023   Pt will tolerate aquatic therapy without adverse effects for improved tolerance to activity, pain, strength, and function.  Baseline: Goal status: Met 02/28/23  2.  Pt will report at least a 25% improvement in pain and sx's overall.  Baseline:  Goal status: Ongoing 02/28/23  3.  Pt will report at least a 25% decrease in freezing episodes with ambulation.   Baseline:  Goal status: ongoing 02/28/23  4.  Pt will demo at least a 25% improvement in each direction of lumbar AROM for improved mobility.  Baseline:  Goal status: ongoing 02/28/23  5.  Pt will ambulate with improved speed and reduced pain with ambulation.   Baseline:  Goal status: ongoing 02/28/23 Target date:   02/11/2023    LONG TERM GOALS: Target date: 04/25/23  Pt will demo lumbar AROM to be [facility] t/o and have less pain with AROM testing for improved daily mobility.  Baseline:  Goal status: INITIAL  2.  Pt will demo  improved strength in bilat hips and knees to at least 4+/5 MMT for improved tolerance with and performance of functional mobility.  Baseline:  Goal status: INITIAL  3.  Pt will be able to walk with wife and also ambulate extended community distance without significant pain.  Baseline:  Goal status: INITIAL  4.  Pt will report at least a 60-70% improvement in pain and sx's overall.  Baseline:  Goal status: INITIAL  5.  Pt will be independent with aquatic HEP and land based core HEP for improved tolerance to activity, functional mobility, pain, and strength.   Baseline:  Goal status: INITIAL  6.  Pt will demo proper body mechanics with squat to lift/hip hinge in order to perform functional lifting with reduced stress and strain on lumbar spine. Baseline:  Goal status: INITIAL  PLAN:  PT FREQUENCY: 1xweek  PT DURATION: 8 weeks  PLANNED INTERVENTIONS: Therapeutic exercises, Therapeutic activity, Neuromuscular re-education, Gait training, Patient/Family education, Self Care, Joint mobilization, Stair training, Aquatic Therapy, Dry Needling, Electrical stimulation, Spinal mobilization, Cryotherapy, Moist heat, Taping, Traction, Ultrasound, Manual therapy, and Re-evaluation.  PLAN FOR NEXT SESSION: Cont with core and LE strengthening, lumbar mobility, LE flexibility, and manual therapy   Audie Clear III PT, DPT 04/12/23 4:12 PM

## 2023-04-12 ENCOUNTER — Encounter (HOSPITAL_BASED_OUTPATIENT_CLINIC_OR_DEPARTMENT_OTHER): Payer: Self-pay | Admitting: Physical Therapy

## 2023-04-18 ENCOUNTER — Ambulatory Visit (HOSPITAL_BASED_OUTPATIENT_CLINIC_OR_DEPARTMENT_OTHER): Payer: No Typology Code available for payment source | Admitting: Physical Therapy

## 2023-04-25 ENCOUNTER — Ambulatory Visit (HOSPITAL_BASED_OUTPATIENT_CLINIC_OR_DEPARTMENT_OTHER): Payer: No Typology Code available for payment source | Admitting: Physical Therapy

## 2023-04-25 DIAGNOSIS — M6281 Muscle weakness (generalized): Secondary | ICD-10-CM

## 2023-04-25 DIAGNOSIS — M5459 Other low back pain: Secondary | ICD-10-CM

## 2023-04-25 DIAGNOSIS — R262 Difficulty in walking, not elsewhere classified: Secondary | ICD-10-CM

## 2023-04-25 NOTE — Therapy (Addendum)
OUTPATIENT PHYSICAL THERAPY THORACOLUMBAR      Patient Name: Christopher Freeman MRN: [id number] DOB:22-May-1971, 52 y.o., male Today's Date: 04/26/2023  END OF SESSION:  PT End of Session - 04/25/23 1721     Visit Number 9    Number of Visits 17    Date for PT Re-Evaluation 06/06/23    Authorization Type [facility]/[employer]    PT Start Time 1622    PT Stop Time 1712    PT Time Calculation (min) 50 min    Activity Tolerance Patient tolerated treatment well    Behavior During Therapy [facility] for tasks assessed/performed                 History reviewed. No pertinent past medical history. History reviewed. No pertinent surgical history. There are no problems to display for this patient.   PCP: Sherwood Gambler, MD  REFERRING PROVIDER: Sherwood Gambler, MD  REFERRING DIAG: M54.50 (ICD-10-CM) - Low back pain, unspecified  Rationale for Evaluation and Treatment: Rehabilitation  THERAPY DIAG:  Other low back pain  Muscle weakness (generalized)  Difficulty in walking, not elsewhere classified  ONSET DATE: PT orders in August and December 2023  SUBJECTIVE:                                                                                                                                                                                           SUBJECTIVE STATEMENT: Pt states the sciatica has been kicked up lately.  Pt states he is always in pain.  He reports the exercises have been helpful.  He states the exercises reduce pain.  Pt reports 15% improvement in pain and sx's overall since starting PT.  Pt reports 25% improvement in decreased freezing episodes.  Pt is limited with ambulation distance and has increased pain with faster gait speed.  Pt reports he felt better after prior Rx.  Pt reports compliance with HEP.    PERTINENT HISTORY:  Chronic low back pain Hx of cervical pain, depression, HTN, and tinnitus  PAIN:  Are you having pain? Yes NPRS:  5/10 /  6/10 current;  4.5/10 best, 10/10 worst Location:  lumbar / sciatica in bilat LE's  PRECAUTIONS: None  WEIGHT BEARING RESTRICTIONS: No  FALLS:  Has patient fallen in last 6 months? 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   PLOF: Independent.  Pt has a hx of chronic pain which affect his daily activities and functional mobility.   PATIENT GOALS: reduce pain.  To be able to function to have a decent QOL  OBJECTIVE:   DIAGNOSTIC FINDINGS:  Pt has has diagnostic testing at other facilities though PT unable to view any reports.   TODAY'S TREATMENT:                                                                                                                               PATIENT SURVEYS:   Modified Oswestry:  31 = 62%                LUMBAR ROM:    AROM eval 5/16   Flexion 40% 75% with pain  Extension 25%  25% pain greater in ext than flex  Right lateral flexion 60% 60% pain greater on R    Left lateral flexion 60%  80% with pain  Right rotation 30%  60% with pain  Left rotation 25%  25% with pain greater on L       LOWER EXTREMITY MMT:       MMT  Right eval Left eval Right 5/16 Left 5/16  Hip flexion 4-/5 4/5 4-/5 4/5  Hip extension        Hip abduction        Hip adduction        Hip internal rotation        Hip external rotation 3+/5 4/5 4-/5 4/5  Knee flexion 4-/5 seated 4/5 seated  4/5 seated 4/5 seated  Knee extension 4-/5 4-/5 4-/5 4+/5  Ankle dorsiflexion        Ankle plantarflexion Weak tested in sitting.  Weaker on R Weak tested in sitting Weak though has improved, seated WFL seated  Ankle inversion        Ankle eversion         (Blank rows = not tested)       GAIT: Assistive device utilized: None Level of assistance: Complete Independence Comments: Slow, antalgic gait.  Increased lumbar pain with ambulation.   Modified Oswestry:  62%  Reviewed current function, response to prior rx, HEP  compliance, and pain level.     Manual Therapy: Pt received STM to bilat lumbar paraspinals and R glute in L S/L'ing to improve pain, soft tissue tightness, and mobility.   PATIENT EDUCATION:  Education details: exercise form and rationale, POC, andrelevant anatomy  Person educated: Patient Education method: Explanation, demonstration, verbal and tactile cues Education comprehension: verbalized understanding, returned demonstration, verbal and tactile cues  HOME EXERCISE PROGRAM: Access Code: [email or web address] URL: [email or web address] Date: 02/21/2023 Prepared by: Geni Bers  Exercises - Quadruped Cat with Posterior Pelvic Tilt  - 1 x daily - 7 x weekly - 3 sets - 10 reps - Supine Pelvic Tilt  - 1 x daily - 7 x weekly - 3 sets - 10 reps - Standing Lumbar Spine Flexion Stretch Counter  - 1 x daily - 7 x weekly - 3 sets - 10 reps - Supine Lower Trunk Rotation  - 1 x daily - 7 x weekly - 3 sets -  10 reps  ASSESSMENT:  CLINICAL IMPRESSION: Pt continues to have constant pain.  He reports 15% / 25% improvement in pain and sx's overall / decreased freezing episodes respectively.  Pt is limited with ambulation distance and has increased pain with faster gait speed.  He has no improvement in gait.  He continues to have pain and limitations with lumbar AROM though demonstrates improved lumbar flexion, L Sb'ing, and R rotation AROM.  Pt continues to have weakness in bilat LE's though has improved in strength in bilat LE's.  Pt is compliant with HEP and reports the home exercise help.  Pt has met STG's #1,3, partially met STG #4, and is progress toward STG #2 and LTG's #2,5. Pt responded well to STW and states she feels much better after Rx, not as tight.  Pt may benefit from continued skilled PT to address impairments and ongoing goals and to improve overall function.     OBJECTIVE IMPAIRMENTS: Abnormal gait, decreased activity tolerance, decreased endurance, decreased mobility,  difficulty walking, decreased ROM, decreased strength, hypomobility, increased fascial restrictions, impaired flexibility, and pain.   ACTIVITY LIMITATIONS: carrying, lifting, bending, sitting, standing, squatting, sleeping, stairs, and locomotion level  PARTICIPATION LIMITATIONS: meal prep, cleaning, laundry, shopping, community activity, and occupation  PERSONAL FACTORS: Time since onset of injury/illness/exacerbation and 1-2 comorbidities: cervical/thoracic pain and depression  are also affecting patient's functional outcome.   REHAB POTENTIAL: Good  CLINICAL DECISION MAKING: Evolving/moderate complexity  EVALUATION COMPLEXITY: Moderate   GOALS:  SHORT TERM GOALS: Target date: 02/04/2023   Pt will tolerate aquatic therapy without adverse effects for improved tolerance to activity, pain, strength, and function.  Baseline: Goal status: Met 02/28/23  2.  Pt will report at least a 25% improvement in pain and sx's overall.  Baseline:  Goal status: PROGRESSING 04/25/23  3.  Pt will report at least a 25% decrease in freezing episodes with ambulation.   Baseline:  Goal status: GOAL MET  5/16  4.  Pt will demo at least a 25% improvement in each direction of lumbar AROM for improved mobility.  Baseline:  Goal status: 35% met 5/16  5.  Pt will ambulate with improved speed and reduced pain with ambulation.   Baseline:  Goal status: ongoing 02/28/23 Target date:  02/11/2023    LONG TERM GOALS: Target date: 06/06/23  Pt will demo lumbar AROM to be [facility] t/o and have less pain with AROM testing for improved daily mobility.  Baseline:  Goal status:  ONGOING  2.  Pt will demo improved strength in bilat hips and knees to at least 4+/5 MMT for improved tolerance with and performance of functional mobility.  Baseline:  Goal status: PROGRESSING  3.  Pt will be able to walk with wife and also ambulate extended community distance without significant pain.  Baseline:  Goal status:  ONGOING  4.  Pt will report at least a 60-70% improvement in pain and sx's overall.  Baseline:  Goal status:ONGOING  5.  Pt will be independent with aquatic HEP and land based core HEP for improved tolerance to activity, functional mobility, pain, and strength.   Baseline:  Goal status: progressing  6.  Pt will demo proper body mechanics with squat to lift/hip hinge in order to perform functional lifting with reduced stress and strain on lumbar spine. Baseline:  Goal status: INITIAL  PLAN:  PT FREQUENCY: 1-2x/week  PT DURATION: 6 weeks  PLANNED INTERVENTIONS: Therapeutic exercises, Therapeutic activity, Neuromuscular re-education, Gait training, Patient/Family education, Self Care, Joint mobilization,  Stair training, Aquatic Therapy, Dry Needling, Electrical stimulation, Spinal mobilization, Cryotherapy, Moist heat, Taping, Traction, Ultrasound, Manual therapy, and Re-evaluation.  PLAN FOR NEXT SESSION: Cont with core and LE strengthening, lumbar mobility, LE flexibility, and manual therapy   Audie Clear III PT, DPT 04/26/23 3:23 PM  PHYSICAL THERAPY DISCHARGE SUMMARY  Visits from Start of Care: 9  Current functional level related to goals / functional outcomes: See above   Remaining deficits: See above   Education / Equipment: HEP   Patient was seen from 01/14/23- 04/25/23.  A progress note was completed on 04/25/23.  Pt did not schedule any further appointments after 5/16 and will be considered discharged at this time.  See above for goal progress and objective findings as of 5/16.     Audie Clear III PT, DPT 01/31/24 8:52 AM

## 2023-04-26 ENCOUNTER — Encounter (HOSPITAL_BASED_OUTPATIENT_CLINIC_OR_DEPARTMENT_OTHER): Payer: Self-pay | Admitting: Physical Therapy
